# Patient Record
Sex: Male | Born: 1965 | Hispanic: Yes | Marital: Single | Smoking: Never smoker
Health system: Southern US, Community
[De-identification: ages and names within clinical notes are randomized; demographics above are authoritative.]

## PROBLEM LIST (undated history)

## (undated) DIAGNOSIS — E079 Disorder of thyroid, unspecified: Secondary | ICD-10-CM

---

## 2017-06-10 ENCOUNTER — Ambulatory Visit: Payer: Worker's Compensation | Attending: Physical Medicine and Rehabilitation | Admitting: Physical Therapy

## 2017-06-10 ENCOUNTER — Encounter: Payer: Self-pay | Admitting: Physical Therapy

## 2017-06-10 DIAGNOSIS — G8929 Other chronic pain: Secondary | ICD-10-CM | POA: Insufficient documentation

## 2017-06-10 DIAGNOSIS — R293 Abnormal posture: Secondary | ICD-10-CM | POA: Insufficient documentation

## 2017-06-10 DIAGNOSIS — M545 Low back pain: Secondary | ICD-10-CM | POA: Insufficient documentation

## 2017-06-10 DIAGNOSIS — M62830 Muscle spasm of back: Secondary | ICD-10-CM | POA: Insufficient documentation

## 2017-06-10 NOTE — Patient Instructions (Signed)
   ADL - LOG ROLL  GETTING IN BED: Start by sitting on the edge of the bed. Next, lower your self down lying on your side using your arms. Once fully on your side, roll onto your back.  When rolling be sure y our knees stay bent and that you roll your whole body together as one unit. Your shoulders, pelvis and knees all roll as one.    GETTING OUT OF BED: Start by bending your knees and then roll onto your side. Reach your arm across your body to initiate the rolling. When rolling, be sure that you roll your whole body together as one unit. Your shoulders, pelvis and knees should all roll together. Once on our side, tip yourself up to sitting using your arms.     [facility] [address], [address] [place], [place] [address] Phone # [phone] Fax [phone]  

## 2017-06-10 NOTE — Therapy (Signed)
[facility] [address] [place], [place], [address] Phone: [phone]   Fax:  [phone]  Physical Therapy Evaluation  Patient Details  Name: Raymond Nielsen MRN: [id number] Date of Birth: 02/22/1966 Referring Provider: Carney Harder, MD    Encounter Date: 06/10/2017  PT End of Session - 06/10/17 1252    Visit Number  1    Number of Visits  12    Date for PT Re-Evaluation  07/22/17    Authorization Time Period  06/10/17 to 07/22/17    Authorization - Visit Number  1    Authorization - Number of Visits  12    PT Start Time  1607    PT Stop Time  1145    PT Time Calculation (min)  43 min    Activity Tolerance  Patient tolerated treatment well    Behavior During Therapy  [facility] for tasks assessed/performed       History reviewed. No pertinent past medical history.  History reviewed. No pertinent surgical history.  There were no vitals filed for this visit.   Subjective Assessment - 06/10/17 1105    Subjective  Pt reports that his low back and Lt leg started bothering him back in June when he was placing a box down onto the ground. He also reports Lt shoulder pain that was onset the next day. Currently, he feels that his low back is the most irritating and would benefit from addressing this area first. 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 His back is limiting his ability to participate in the job. He reports no change in bowel/bladder function.     Pertinent History  asthmas    Limitations  Lifting    How long can you sit comfortably?  10-15 min     How long can you walk comfortably?  unlimited     Patient Stated Goals  improve pain and ability to complete lifting for work.     Currently in Pain?  Other (Comment) no pain, just uncomfortable    no pain, just uncomfortable    Pain Location  Back    Pain Orientation  Left    Pain Descriptors / Indicators  Aching;Throbbing    Pain  Type  Chronic pain    Pain Radiating Towards  numbness lateral aspect of Lt thigh     Pain Onset  More than a month ago    Pain Frequency  Constant    Aggravating Factors   bending forward, sitting for too long, laying flat on his back      Pain Relieving Factors  sitting slouched to the Rt, ice, walking     Effect of Pain on Daily Activities  significant limitation with work activity         [facility] PT Assessment - 06/10/17 0001      Assessment   Referring Provider  Carney Harder, MD     Next MD Visit  none for now     Prior Therapy  a couple of weeks       Precautions   Precautions  None      Balance Screen   Has the patient fallen in the past 6 months  No    Has the patient had a decrease in activity level because of a fear of falling?   No    Is the patient reluctant to leave their home because of a fear of falling?  No      Home Film/video editor residence      Prior Function   Vocation  Full time employment    Vocation Requirements  currently not working, Company secretary of driving/lifting       Cognition   Overall Cognitive Status  Within Functional Limits for tasks assessed      Observation/Other Assessments   Other Surveys   Other Surveys    Oswestry Disability Index   48% limited (Severe disability)       Sensation   Light Touch  Impaired by gross assessment    Additional Comments  Impaired sensation over Lt lateral thigh       Posture/Postural Control   Posture Comments  Pt sitting with Rt lateral shift, forward head/rounded shoulders, increased thoracic kyphosis       ROM / Strength   AROM / PROM / Strength  AROM;Strength      AROM   AROM Assessment Site  Lumbar    Lumbar Flexion  50% limited, pulling pain    Lumbar Extension  75% limited, pain end range (clicking noted)       Strength   Overall Strength Comments  Unable to accurately assess due to high pain report with resistance testing       Flexibility   Soft Tissue Assessment /Muscle  Length  yes    Quadriceps  (+) Ely's BLE    Piriformis  50% limited       Palpation   Spinal mobility  pt unable to tolerate spring testing, generally hypomobile throughout the lumbar spine     SI assessment   Lt posterior innominate, possible upslip on the Lt     Palpation comment  tenderness along lumbar paraspinals/L4-L5 multifidi, Lt SI tender with palpation      Transfers   Comments  Use of UE to assist with standing              Objective measurements completed on examination: See above findings.      [facility] PT Treatment/Exercise - 06/10/17 0001      Therapeutic Activites    Therapeutic Activities  Other Therapeutic Activities    Other Therapeutic Activities  log roll x2 reps with therapist instruction to improve technique and understanding       Exercises   Exercises  Lumbar      Lumbar Exercises: Supine   Straight Leg Raise  5 reps    Straight Leg Raises Limitations  LLE only       Manual Therapy   Manual Therapy  Muscle Energy Technique;Soft tissue mobilization    Soft tissue mobilization  STM Lt lumbar paraspinals     Muscle Energy Technique  MET to correct Lt posterior innominate              PT Education - 06/10/17 1251    Education provided  Yes    Education Details  eval findings/POC; benefits of improving mechanics with bed mobility/lifting to decrease strain on low back/neck; log roll technique    Person(s) Educated  Patient    Methods  Explanation;Verbal cues;Handout    Comprehension  Returned demonstration;Verbalized understanding       PT Short Term Goals - 06/10/17 1301      PT SHORT TERM GOAL #1   Title  Pt will demo consistency and independence with his HEP to increase his flexibility and decrease pain.     Time  3    Period  Weeks    Status  New    Target Date  07/01/17      PT SHORT TERM GOAL #2   Title  Pt will demo improved mobility and awareness of proper body mechanics, evident by his ability to complete log roll  when transferring on/off the mat table during his sessions without cues from the therapist.     Time  3    Period  Weeks    Status  New        PT Long Term Goals - 06/10/17 1305      PT LONG TERM GOAL #1   Title  Pt will be able to lift a 10# box with proper mechanics and without cuing from the therapist, ateast 5 reps, to allow for carry over into daily house/work activity.      Time  6    Period  Weeks    Status  New    Target Date  07/22/17      PT LONG TERM GOAL #2   Title  Pt will demo improved BLE quadriceps flexibility, evident by atleast 100 deg of knee flexion during Ely's testing.     Time  6    Period  Weeks    Status  New      PT LONG TERM GOAL #3   Title  Pt will report atleast 40% improvement in his low back pain from the start of therapy, to improve his quality of life and activity participation.     Time  6    Period  Weeks    Status  New      PT LONG TERM GOAL #4   Title  Pt will demo atleast 10% improvement on the Oswestry low back pain disability questionnaire to represent an improvement in his functional capacity.     Time  6    Period  Weeks    Status  New             Plan - 06/10/17 1255    Clinical Impression Statement  Pt is a 51 y.o M referred to OPPT by his MD for complaints of Lt neck/shoulder and Lt low back/LE pain onset several months ago when trying to lower a box to the ground. Since the onset, he has had some therapy up in [place], however he recently relocated to [facility] and is hoping to continue his therapy now that he is here. Pt currently would like to focus on his low back secondary to how this is limiting his ability to participate in his job which involves lifting. He presents with significantly limited lumbar AROM, muscle spasm along the low lumbar region, hypomobile lumbar segments and possible pelvic asymmetries contributing to his pain and referred symptoms down his lateral thigh. He demonstrates poor postural awareness and body  mechanics with bed mobility, and required education today regarding proper log roll technique when getting in/out of bed. Therapist was unable to complete formal strength testing due to high levels of pain, however he does demonstrate general limitations in hip flexibility. Pt would benefit from skilled PT to address his limitations in ROM, strength, body mechanics and decrease his pain with daily activity.     Clinical Presentation  Unstable    Clinical Presentation due to:  high pain levels with positioning/movements; multiple areas of pain/concerns    Clinical Decision Making  Moderate    Rehab Potential  Fair    Clinical Impairments Affecting Rehab Potential  (+) pt appears to  be highly motivated, (-) chronicity of symptoms    PT Frequency  2x / week    PT Duration  6 weeks    PT Treatment/Interventions  ADLs/Self Care Home Management;Cryotherapy;Electrical Stimulation;Moist Heat;Traction;Therapeutic exercise;Therapeutic activities;Functional mobility training;Neuromuscular re-education;Patient/family education;Manual techniques;Passive range of motion;Dry needling;Taping    PT Next Visit Plan  further SI assessment; gentle hip ROM SKTC/DKTC/low trunk rotation; deep abdominal activation     PT Home Exercise Plan  next session     Consulted and Agree with Plan of Care  Patient       Patient will benefit from skilled therapeutic intervention in order to improve the following deficits and impairments:  Decreased activity tolerance, Impaired flexibility, Hypomobility, Decreased strength, Decreased range of motion, Postural dysfunction, Increased muscle spasms, Decreased mobility, Impaired sensation, Improper body mechanics, Pain  Visit Diagnosis: Chronic left-sided low back pain, with sciatica presence unspecified  Muscle spasm of back  Abnormal posture     Problem List There are no active problems to display for this patient.   1:33 PM,06/10/17 [place], [place] at [place]  [facility] [address] [place], [place], [address] Phone: [phone]   Fax:  [phone]  Name: Raymond Nielsen MRN: [id number] Date of Birth: [birth year]

## 2017-06-17 ENCOUNTER — Ambulatory Visit: Payer: Worker's Compensation | Admitting: Physical Therapy

## 2017-06-17 ENCOUNTER — Encounter: Payer: Self-pay | Admitting: Physical Therapy

## 2017-06-17 DIAGNOSIS — M545 Low back pain: Secondary | ICD-10-CM | POA: Diagnosis not present

## 2017-06-17 DIAGNOSIS — M62830 Muscle spasm of back: Secondary | ICD-10-CM

## 2017-06-17 DIAGNOSIS — R293 Abnormal posture: Secondary | ICD-10-CM

## 2017-06-17 DIAGNOSIS — G8929 Other chronic pain: Secondary | ICD-10-CM

## 2017-06-17 NOTE — Patient Instructions (Signed)
Supine Knee-to-Chest, Unilateral    Lie on back, hands clasped behind left knee. Pull knee in toward chest until a comfortable stretch is felt in lower back and buttocks and keep your other knee bent, not straight like it is in the picture. . Hold __3_ breaths. Relax Repeat _3__ times per session. Do _3__ sessions per day.  Copyright  VHI. All rights reserved.   Lower Trunk Rotation    Bring both knees in to chest. Rotate from side to side, keeping knees together and feet on the floor.Start with gentle rocking side to side, THEN as it feels better hold the stretch on each side for 3 breaths.  Repeat _10 rocks per set. Building to 3 breath holds each side. . Do 2_ sessions per day.  http://orth.exer.us/150   Copyright  VHI. All rights reserved.     Piriformis Stretch, Supine    Lie supine, left ankle on right knee.Gently pull LT knee towards the right shoulder. You should feel stretch in your hip & buttocks. Hold _3_breaths.   Repeat __3_ times per session. Do __3_ sessions per day.  Copyright  VHI. All rights reserved.

## 2017-06-17 NOTE — Therapy (Signed)
[facility] [address] [place], [place], [address] Phone: [phone]   Fax:  [phone]  Physical Therapy Treatment  Patient Details  Name: Raymond MaineDaniel Nielsen MRN: [id number] Date of Birth: 11-01-1965 Referring Provider: Leta JunglingLam Quan, MD    Encounter Date: 06/17/2017  PT End of Session - 06/17/17 1232    Visit Number  2    Number of Visits  12    Date for PT Re-Evaluation  07/22/17    Authorization Time Period  06/10/17 to 07/22/17    Authorization - Visit Number  2    Authorization - Number of Visits  12    PT Start Time  1230    PT Stop Time  1320    PT Time Calculation (min)  50 min    Activity Tolerance  Patient tolerated treatment well    Behavior During Therapy  [facility]       History reviewed. No pertinent past medical history.  History reviewed. No pertinent surgical history.  There were no vitals filed for this visit.  Subjective Assessment - 06/17/17 1234    Subjective  Yesterday was a good day, today is slightly more.     Currently in Pain?  Yes    Pain Score  4     Pain Location  Back    Pain Orientation  Left    Pain Descriptors / Indicators  Aching    Aggravating Factors   sitting too long    Pain Relieving Factors  decompression    Multiple Pain Sites  No                      [facility] Adult PT Treatment/Exercise - 06/17/17 0001      Lumbar Exercises: Stretches   Single Knee to Chest Stretch  3 reps;20 seconds BIl    Lower Trunk Rotation  -- Small rocking 10x, tried static stretch pt declined    Piriformis Stretch  2 reps;20 seconds      Lumbar Exercises: Aerobic   Stationary Bike  L1 x 5 min  PTA present with status update.       Lumbar Exercises: Supine   Ab Set  -- TA contraction with PIlates breath.      Cryotherapy   Number Minutes Cryotherapy  10 Minutes post session    Cryotherapy Location  Lumbar Spine    Type of Cryotherapy  Ice pack       Manual Therapy   Manual Therapy  Manual Traction    Manual Traction  Attempted LTLE long leg traction. Stopped per pt request, it hurt.              PT Education - 06/17/17 1524    Education provided  Yes    Education Details  Basic HEP: single knee to chest, lower trunk rocking, use of ice, piriformis stretch LT    Person(s) Educated  Patient    Methods  Explanation;Demonstration;Tactile cues;Verbal cues;Handout    Comprehension  Verbalized understanding;Returned demonstration       PT Short Term Goals - 06/10/17 1301      PT SHORT TERM GOAL #1   Title  Pt will demo consistency and independence with his HEP to increase his flexibility and decrease pain.     Time  3    Period  Weeks    Status  New    Target Date  07/01/17      PT SHORT TERM  GOAL #2   Title  Pt will demo improved mobility and awareness of proper body mechanics, evident by his ability to complete log roll when transferring on/off the mat table during his sessions without cues from the therapist.     Time  3    Period  Weeks    Status  New        PT Long Term Goals - 06/10/17 1305      PT LONG TERM GOAL #1   Title  Pt will be able to lift a 10# box with proper mechanics and without cuing from the therapist, ateast 5 reps, to allow for carry over into daily house/work activity.      Time  6    Period  Weeks    Status  New    Target Date  07/22/17      PT LONG TERM GOAL #2   Title  Pt will demo improved BLE quadriceps flexibility, evident by atleast 100 deg of knee flexion during Ely's testing.     Time  6    Period  Weeks    Status  New      PT LONG TERM GOAL #3   Title  Pt will report atleast 40% improvement in his low back pain from the start of therapy, to improve his quality of life and activity participation.     Time  6    Period  Weeks    Status  New      PT LONG TERM GOAL #4   Title  Pt will demo atleast 10% improvement on the Oswestry low back pain disability questionnaire to  represent an improvement in his functional capacity.     Time  6    Period  Weeks    Status  New            Plan - 06/17/17 1233    Clinical Impression Statement  Pt presents today with low level pain but significantly afraid to move due to pain. Pt was able to perform low level stretches without exacerbating his pain. These were given to him for HEP. Pt requested trial of traction. we started with manual traction to the LTLE which he did not tolerate. He asked that PTA stop due to pain in the LT hip/side. He agreeed to ice at home.      Rehab Potential  Fair    Clinical Impairments Affecting Rehab Potential  (+) pt appears to be highly motivated, (-) chronicity of symptoms    PT Frequency  2x / week    PT Duration  6 weeks    PT Treatment/Interventions  ADLs/Self Care Home Management;Cryotherapy;Electrical Stimulation;Moist Heat;Traction;Therapeutic exercise;Therapeutic activities;Functional mobility training;Neuromuscular re-education;Patient/family education;Manual techniques;Passive range of motion;Dry needling;Taping    PT Next Visit Plan  See if pt doing HEp given today. He had no further appts after today and reports he plans on calling our office tomorrow to schedule.     Consulted and Agree with Plan of Care  Patient       Patient will benefit from skilled therapeutic intervention in order to improve the following deficits and impairments:  Decreased activity tolerance, Impaired flexibility, Hypomobility, Decreased strength, Decreased range of motion, Postural dysfunction, Increased muscle spasms, Decreased mobility, Impaired sensation, Improper body mechanics, Pain  Visit Diagnosis: Chronic left-sided low back pain, with sciatica presence unspecified  Muscle spasm of back  Abnormal posture     Problem List There are no active problems to display for this patient.  Raymond Nielsen, PTA 06/17/2017, 3:26 PM  [facility] [address] [place], [place], [address] Phone: [phone]   Fax:  [phone]  Name: Raymond Nielsen MRN: [id number] Date of Birth: [birth year]

## 2017-06-24 ENCOUNTER — Encounter: Payer: Self-pay | Admitting: Physical Therapy

## 2017-06-24 ENCOUNTER — Ambulatory Visit: Payer: Worker's Compensation | Admitting: Physical Therapy

## 2017-06-24 DIAGNOSIS — M545 Low back pain: Principal | ICD-10-CM

## 2017-06-24 DIAGNOSIS — G8929 Other chronic pain: Secondary | ICD-10-CM

## 2017-06-24 DIAGNOSIS — R293 Abnormal posture: Secondary | ICD-10-CM

## 2017-06-24 DIAGNOSIS — M62830 Muscle spasm of back: Secondary | ICD-10-CM

## 2017-06-24 NOTE — Therapy (Signed)
[facility] [address], [address] [place], [place], [address] Phone: [phone]   Fax:  [phone]  Physical Therapy Treatment  Patient Details  Name: Raymond MaineDaniel Nielsen MRN: [id number] Date of Birth: [birth year] Referring Provider: Leta JunglingLam Quan, MD    Encounter Date: 06/24/2017  PT End of Session - 06/24/17 1113    Visit Number  3    Number of Visits  12    Date for PT Re-Evaluation  07/22/17    Authorization Time Period  06/10/17 to 07/22/17    Authorization - Visit Number  3    Authorization - Number of Visits  12    PT Start Time  1105    PT Stop Time  1205    PT Time Calculation (min)  60 min    Activity Tolerance  Patient tolerated treatment well    Behavior During Therapy  [facility] for tasks assessed/performed       History reviewed. No pertinent past medical history.  History reviewed. No pertinent surgical history.  There were no vitals filed for this visit.  Subjective Assessment - 06/24/17 1115    Subjective  Pain still high. Reports his back "gave way" when sneezing yesterday and "fell to the floor." He was "sore" after last session but this might have been more muscular.     Currently in Pain?  Yes Numbness remains lateral thigh, numbness bil hands    Pain Score  8     Pain Location  Back    Pain Orientation  Left;Lower    Pain Descriptors / Indicators  Constant    Multiple Pain Sites  No                      [facility] Adult PT Treatment/Exercise - 06/24/17 0001      Lumbar Exercises: Stretches   Single Knee to Chest Stretch  3 reps;10 seconds bil      Lumbar Exercises: Aerobic   Stationary Bike  Nustep L1 x 10 min      Lumbar Exercises: Sidelying   Other Sidelying Lumbar Exercises  TA contraction with glute set 2x5 Got uncomfortable in supine, S-L was better.       Lumbar Exercises: Prone   Other Prone Lumbar Exercises  Could not lay prone d/t pain      Cryotherapy   Number Minutes  Cryotherapy  15 Minutes    Cryotherapy Location  Lumbar Spine    Type of Cryotherapy  Ice pack      Electrical Stimulation   Electrical Stimulation Location  Lt lumbar to proximal glutes    Electrical Stimulation Action  IFC    Electrical Stimulation Parameters  to tolerance,     Electrical Stimulation Goals  Pain               PT Short Term Goals - 06/24/17 1150      PT SHORT TERM GOAL #1   Title  Pt will demo consistency and independence with his HEP to increase his flexibility and decrease pain.     Time  3    Period  Weeks    Status  On-going      PT SHORT TERM GOAL #2   Title  Pt will demo improved mobility and awareness of proper body mechanics, evident by his ability to complete log roll when transferring on/off the mat table during his sessions without cues from the therapist.     Time  3  Period  Weeks    Status  Achieved        PT Long Term Goals - 06/10/17 1305      PT LONG TERM GOAL #1   Title  Pt will be able to lift a 10# box with proper mechanics and without cuing from the therapist, ateast 5 reps, to allow for carry over into daily house/work activity.      Time  6    Period  Weeks    Status  New    Target Date  07/22/17      PT LONG TERM GOAL #2   Title  Pt will demo improved BLE quadriceps flexibility, evident by atleast 100 deg of knee flexion during Ely's testing.     Time  6    Period  Weeks    Status  New      PT LONG TERM GOAL #3   Title  Pt will report atleast 40% improvement in his low back pain from the start of therapy, to improve his quality of life and activity participation.     Time  6    Period  Weeks    Status  New      PT LONG TERM GOAL #4   Title  Pt will demo atleast 10% improvement on the Oswestry low back pain disability questionnaire to represent an improvement in his functional capacity.     Time  6    Period  Weeks    Status  New            Plan - 06/24/17 1114    Clinical Impression Statement  Pt  presents today with higher level of painand could not tolerate any one position for very long. Because of this pt agreed to try some Estim with ice to attempt pain reduction. Pt had requested work on the LT shoulder today. PTA explained it was not evaluated initially so we could not do that today. PLan to have follow up discussion with evaluating PT to check on this.     Rehab Potential  Fair    Clinical Impairments Affecting Rehab Potential  (+) pt appears to be highly motivated, (-) chronicity of symptoms    PT Frequency  2x / week    PT Duration  6 weeks    PT Treatment/Interventions  ADLs/Self Care Home Management;Cryotherapy;Electrical Stimulation;Moist Heat;Traction;Therapeutic exercise;Therapeutic activities;Functional mobility training;Neuromuscular re-education;Patient/family education;Manual techniques;Passive range of motion;Dry needling;Taping    PT Next Visit Plan  Gentle movements, stretches, low level core. Pain management.     Consulted and Agree with Plan of Care  Patient       Patient will benefit from skilled therapeutic intervention in order to improve the following deficits and impairments:  Decreased activity tolerance, Impaired flexibility, Hypomobility, Decreased strength, Decreased range of motion, Postural dysfunction, Increased muscle spasms, Decreased mobility, Impaired sensation, Improper body mechanics, Pain  Visit Diagnosis: Chronic left-sided low back pain, with sciatica presence unspecified  Muscle spasm of back  Abnormal posture     Problem List There are no active problems to display for this patient.   Ursula Dermody, PTA 06/24/2017, 11:51 AM  [facility] [address], [address] [place], [place], [address] Phone: [phone]   Fax:  [phone]  Name: Raymond MaineDaniel Nielsen MRN: [id number] Date of Birth: February 10, 1966

## 2017-06-30 ENCOUNTER — Ambulatory Visit: Payer: Worker's Compensation

## 2017-06-30 DIAGNOSIS — M545 Low back pain: Principal | ICD-10-CM

## 2017-06-30 DIAGNOSIS — G8929 Other chronic pain: Secondary | ICD-10-CM

## 2017-06-30 DIAGNOSIS — R293 Abnormal posture: Secondary | ICD-10-CM

## 2017-06-30 DIAGNOSIS — M62830 Muscle spasm of back: Secondary | ICD-10-CM

## 2017-06-30 NOTE — Therapy (Signed)
[facility] [address], [address] [place], [place], [address] Phone: [phone]   Fax:  [phone]  Physical Therapy Treatment  Patient Details  Name: Raymond Nielsen MRN: [id number] Date of Birth: 05/16/1966 Referring Provider: Leta JunglingLam Quan, MD    Encounter Date: 06/30/2017  PT End of Session - 06/30/17 1011    Visit Number  4    Number of Visits  12    Date for PT Re-Evaluation  07/22/17    PT Start Time  0950 late    PT Stop Time  1029    PT Time Calculation (min)  39 min    Activity Tolerance  Patient tolerated treatment well    Behavior During Therapy  [facility] for tasks assessed/performed       History reviewed. No pertinent past medical history.  History reviewed. No pertinent surgical history.  There were no vitals filed for this visit.  Subjective Assessment - 06/30/17 0951    Subjective  Pt is 20 minutes late.  My shoulder is sore.      Pertinent History  body weight 206#    Currently in Pain?  Yes    Pain Score  6     Pain Location  Back    Pain Orientation  Left;Lower    Pain Descriptors / Indicators  Constant    Pain Type  Chronic pain    Pain Onset  More than a month ago    Pain Frequency  Constant    Aggravating Factors   bending over, sleep, walking too long    Pain Relieving Factors  lying down         [facility] 06/30/17 0001      Assessment   Medical Diagnosis  lumbar spine, Lt shoulder                  [facility] Adult PT Treatment/Exercise - 06/30/17 0001      Lumbar Exercises: Stretches   Single Knee to Chest Stretch  3 reps;10 seconds bil      Lumbar Exercises: Aerobic   Stationary Bike  Nustep L1 x 7 min      Lumbar Exercises: Sidelying   Clam  20 reps verbal and tactile cues for technique      Modalities   Modalities  Traction      Traction   Type of Traction  Lumbar    Min (lbs)  50    Max (lbs)  110    Hold Time  60    Rest Time  10    Time  15                PT Short Term Goals - 06/30/17 0953      PT SHORT TERM GOAL #1   Title  Pt will demo consistency and independence with his HEP to increase his flexibility and decrease pain.     Status  Achieved      PT SHORT TERM GOAL #2   Title  Pt will demo improved mobility and awareness of proper body mechanics, evident by his ability to complete log roll when transferring on/off the mat table during his sessions without cues from the therapist.     Status  Achieved        PT Long Term Goals - 06/10/17 1305      PT LONG TERM GOAL #1   Title  Pt will be able to lift a 10# box with proper mechanics  and without cuing from the therapist, ateast 5 reps, to allow for carry over into daily house/work activity.      Time  6    Period  Weeks    Status  New    Target Date  07/22/17      PT LONG TERM GOAL #2   Title  Pt will demo improved BLE quadriceps flexibility, evident by atleast 100 deg of knee flexion during Ely's testing.     Time  6    Period  Weeks    Status  New      PT LONG TERM GOAL #3   Title  Pt will report atleast 40% improvement in his low back pain from the start of therapy, to improve his quality of life and activity participation.     Time  6    Period  Weeks    Status  New      PT LONG TERM GOAL #4   Title  Pt will demo atleast 10% improvement on the Oswestry low back pain disability questionnaire to represent an improvement in his functional capacity.     Time  6    Period  Weeks    Status  New            Plan - 06/30/17 1000    Clinical Impression Statement  Pt with continued reports of Lt shoulder pain.  This was not evaluated so will need to be evaluated in order to perform treatment.  Pt was late today so this was not able to be done.  Pt reports minimal compliance with HEP for flexibility.  Trial of lumbar traction to address lumbar symptoms and Lt LE radiculpathy.  Pt will continue to benefit from skilled PT for core strength, flexibility,  traction if helpful and to evaluate Lt shoulder.      Rehab Potential  Fair    PT Frequency  2x / week    PT Duration  6 weeks    PT Treatment/Interventions  ADLs/Self Care Home Management;Cryotherapy;Electrical Stimulation;Moist Heat;Traction;Therapeutic exercise;Therapeutic activities;Functional mobility training;Neuromuscular re-education;Patient/family education;Manual techniques;Passive range of motion;Dry needling;Taping    PT Next Visit Plan  Gentle movements, stretches, low level core. Pain management. Evaluate Lt shoulder    Consulted and Agree with Plan of Care  Patient       Patient will benefit from skilled therapeutic intervention in order to improve the following deficits and impairments:  Decreased activity tolerance, Impaired flexibility, Hypomobility, Decreased strength, Decreased range of motion, Postural dysfunction, Increased muscle spasms, Decreased mobility, Impaired sensation, Improper body mechanics, Pain  Visit Diagnosis: Chronic left-sided low back pain, with sciatica presence unspecified  Muscle spasm of back  Abnormal posture     Problem List There are no active problems to display for this patient.   Lorrene ReidKelly Dell Briner, PT 06/30/17 10:14 AM  [facility] [address], [address] [place], [place], [address] Phone: [phone]   Fax:  [phone]  Name: Raymond Nielsen MRN: [id number] Date of Birth: [birth year]

## 2017-07-07 ENCOUNTER — Ambulatory Visit: Payer: Worker's Compensation | Attending: Physical Medicine and Rehabilitation

## 2017-07-07 DIAGNOSIS — M25612 Stiffness of left shoulder, not elsewhere classified: Secondary | ICD-10-CM | POA: Diagnosis present

## 2017-07-07 DIAGNOSIS — M62830 Muscle spasm of back: Secondary | ICD-10-CM

## 2017-07-07 DIAGNOSIS — R293 Abnormal posture: Secondary | ICD-10-CM | POA: Diagnosis present

## 2017-07-07 DIAGNOSIS — G8929 Other chronic pain: Secondary | ICD-10-CM | POA: Diagnosis present

## 2017-07-07 DIAGNOSIS — M25512 Pain in left shoulder: Secondary | ICD-10-CM | POA: Insufficient documentation

## 2017-07-07 DIAGNOSIS — M545 Low back pain: Secondary | ICD-10-CM | POA: Insufficient documentation
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
## 2017-07-07 NOTE — Patient Instructions (Addendum)
Hold all stretches 5-10 seconds and perform 5-10 times, 3 times a day.   Slide right arm up wall, with  PALM FACING THE WALL, by leaning toward wall.  Copyright  VHI. All rights reserved.   Sitting upright, slide forearm forward along table, bending from the waist until a stretch is felt. Copyright  VHI. All rights reserved.    Clasp hands together and raise arms above head, keeping elbows as straight as possible. Can be done sitting or lying.   Copyright  VHI. All rights reserved.  KEEP HEAD IN NEUTRAL AND SHOULDERS DOWN AND RELAXED   Hold tubing in right hand, arm forward. Pull arm back, elbow straight. Repeat __10__ times per set. Do __2__ sets per session. Do _1-2___ sessions per day.  Copyright  VHI. All rights reserved.     With resistive band anchored in door, grasp both ends. Keeping elbows bent, pull back, squeezing shoulder blades together. Hold _3__ seconds. Repeat _2x10___ times. Do _1-2___ sessions per day.  [email or web address].exer.us/98   [facility] [address], [address] [place], [place] [address] Phone # [phone] Fax [phone]

## 2017-07-09 ENCOUNTER — Ambulatory Visit: Payer: Worker's Compensation | Admitting: Physical Therapy

## 2017-07-10 ENCOUNTER — Encounter: Payer: Self-pay | Admitting: Physical Therapy

## 2017-07-10 ENCOUNTER — Ambulatory Visit: Payer: Worker's Compensation | Admitting: Physical Therapy

## 2017-07-10 DIAGNOSIS — R293 Abnormal posture: Secondary | ICD-10-CM

## 2017-07-10 DIAGNOSIS — M25612 Stiffness of left shoulder, not elsewhere classified: Secondary | ICD-10-CM

## 2017-07-10 DIAGNOSIS — M62830 Muscle spasm of back: Secondary | ICD-10-CM

## 2017-07-10 DIAGNOSIS — M545 Low back pain: Secondary | ICD-10-CM | POA: Diagnosis not present

## 2017-07-10 DIAGNOSIS — G8929 Other chronic pain: Secondary | ICD-10-CM

## 2017-07-10 DIAGNOSIS — M25512 Pain in left shoulder: Secondary | ICD-10-CM

## 2017-07-10 NOTE — Patient Instructions (Signed)

## 2017-07-10 NOTE — Therapy (Signed)
[facility] [address] [place], [place], [address] Phone: [phone]   Fax:  [phone]  Physical Therapy Treatment  Patient Details  Name: Raymond Nielsen MRN: [id number] Date of Birth: 10/04/1965 Referring Provider: Leta Jungling, MD   Encounter Date: 07/10/2017  PT End of Session - 07/10/17 1036    Visit Number  6    Number of Visits  12    Date for PT Re-Evaluation  08/18/17    Authorization Time Period  06/10/17 to 07/22/17    Authorization - Visit Number  6    Authorization - Number of Visits  12    PT Start Time  0930    PT Stop Time  1025    PT Time Calculation (min)  55 min    Activity Tolerance  Patient tolerated treatment well    Behavior During Therapy  [facility] for tasks assessed/performed       History reviewed. No pertinent past medical history.  History reviewed. No pertinent surgical history.  There were no vitals filed for this visit.  Subjective Assessment - 07/10/17 0935    Subjective  I slept on my left shoulder and it went numb.  I am not feeling good today.     Pertinent History  body weight 206#    Limitations  Lifting    How long can you sit comfortably?  10-15 min     How long can you walk comfortably?  unlimited     Patient Stated Goals  improve pain and ability to complete lifting for work.     Currently in Pain?  Yes    Pain Score  5     Pain Location  Back    Pain Orientation  Left;Lower    Pain Descriptors / Indicators  Constant    Pain Type  Chronic pain    Pain Onset  More than a month ago    Pain Frequency  Constant    Aggravating Factors   bending over, sleep , walking too long    Pain Relieving Factors  lay on side    Effect of Pain on Daily Activities  significant limitation with work activity    Multiple Pain Sites  Yes    Pain Score  8    Pain Location  Shoulder    Pain Orientation  Left    Pain Descriptors / Indicators  Aching    Pain Type  Chronic pain    Pain  Onset  More than a month ago    Pain Frequency  Constant    Aggravating Factors   lifting things, push ups, use of left UE     Pain Relieving Factors  rest gentle movement                      [facility] Adult PT Treatment/Exercise - 07/10/17 0001      Lumbar Exercises: Standing   Other Standing Lumbar Exercises  Lateral shift correction with left side against the wall for 2 min; then lumbar extension 5 times but very painful and limited      Shoulder Exercises: Standing   Flexion  AAROM;Strengthening;Left;15 reps against the wall with PT guiding the scapula      Manual Therapy   Manual Therapy  Soft tissue mobilization;Joint mobilization    Joint Mobilization  sacra mobilization to correct left rotation; P-A and rotational mobilization to T3-T8 and L1-L5 grade 3    Soft tissue mobilization  lumbar  paraspinals and left shoulder girdle, upper trap, rhomboid        Trigger Point Dry Needling - 07/10/17 1028    Consent Given?  Yes    Education Handout Provided  Yes    Muscles Treated Upper Body  Upper trapezius;Subscapularis;Infraspinatus    Muscles Treated Lower Body  -- bil. lumbar multifidi L1-L5    Upper Trapezius Response  Twitch reponse elicited;Palpable increased muscle length    Infraspinatus Response  Twitch response elicited;Palpable increased muscle length    Subscapularis Response  Twitch response elicited;Palpable increased muscle length           PT Education - 07/10/17 1036    Education provided  Yes    Education Details  information on dry needling    Person(s) Educated  Patient    Methods  Explanation    Comprehension  Verbalized understanding;Returned demonstration       PT Short Term Goals - 06/30/17 0953      PT SHORT TERM GOAL #1   Title  Pt will demo consistency and independence with his HEP to increase his flexibility and decrease pain.     Status  Achieved      PT SHORT TERM GOAL #2   Title  Pt will demo improved mobility and  awareness of proper body mechanics, evident by his ability to complete log roll when transferring on/off the mat table during his sessions without cues from the therapist.     Status  Achieved        PT Long Term Goals - 07/07/17 [id number]      PT LONG TERM GOAL #1   Title  Pt will be able to lift a 10# box with proper mechanics and without cuing from the therapist, ateast 5 reps, to allow for carry over into daily house/work activity.      Time  6    Period  Weeks    Status  On-going      PT LONG TERM GOAL #2   Title  Pt will demo improved BLE quadriceps flexibility, evident by atleast 100 deg of knee flexion during Ely's testing.     Period  Weeks    Status  On-going      PT LONG TERM GOAL #3   Title  Pt will report atleast 40% improvement in his low back pain from the start of therapy, to improve his quality of life and activity participation.     Time  6    Period  Weeks    Status  On-going      PT LONG TERM GOAL #4   Title  Pt will demo atleast 10% improvement on the Oswestry low back pain disability questionnaire to represent an improvement in his functional capacity.     Time  6    Period  Weeks    Status  On-going      PT LONG TERM GOAL #5   Title  improve quickdash score by 10 points (< or = to 38/100) to improve function of Lt UE    Time  6    Period  Weeks    Status  New    Target Date  08/18/17      Additional Long Term Goals   Additional Long Term Goals  Yes      PT LONG TERM GOAL #6   Title  demonstrate Lt shoulder A/ROM flexion to > or = to 130 to improve overhead reaching    Time  6  Period  Weeks    Status  New    Target Date  08/18/17      PT LONG TERM GOAL #7   Title  report < or = to 4/10 Lt shoulder pain with lifting and use with ADLs    Time  6    Period  Weeks    Status  New    Target Date  08/18/17            Plan - 07/10/17 1037    Clinical Impression Statement  Patient has increased pain in left shoulder today due to laying on it  at night.  Patient has decreased lumbar extension by 100% but after therapy gained 25% motion.  Patient has decreased mobility of L1-L5 but after dry needling increased movement of the spine.  Patient has trouble relaxing the left upper trap with left shoulder motion and needed tactile cues to relax.  Patient had trigger points in the lumbar and left shoulder girdle.  Patient will benefit from skilled therapy to improve left shoulder flexibility, postural strength, rotator cuff strength and address lumbar pain and core strength.     Rehab Potential  Fair    Clinical Impairments Affecting Rehab Potential  (+) pt appears to be highly motivated, (-) chronicity of symptoms    PT Frequency  2x / week    PT Duration  6 weeks    PT Treatment/Interventions  ADLs/Self Care Home Management;Cryotherapy;Electrical Stimulation;Moist Heat;Traction;Therapeutic exercise;Therapeutic activities;Functional mobility training;Neuromuscular re-education;Patient/family education;Manual techniques;Passive range of motion;Dry needling;Taping    PT Next Visit Plan  Gentle movements, stretches, low level core. Pain management. Lt shoulder-gentle flexibility and postural strength.  See how dry needling helped patient    PT Home Exercise Plan  progress left shoulder HEP with wall slides, pendulums, scapula retraction    Consulted and Agree with Plan of Care  Patient       Patient will benefit from skilled therapeutic intervention in order to improve the following deficits and impairments:  Decreased activity tolerance, Impaired flexibility, Hypomobility, Decreased strength, Decreased range of motion, Postural dysfunction, Increased muscle spasms, Decreased mobility, Impaired sensation, Improper body mechanics, Pain  Visit Diagnosis: Chronic left-sided low back pain, with sciatica presence unspecified  Muscle spasm of back  Abnormal posture  Chronic left shoulder pain  Stiffness of left shoulder, not elsewhere  classified     Problem List There are no active problems to display for this patient.   Eulis FosterCheryl Gray, PT 07/10/17 10:41 AM   [facility] [address], [address] [place], [place], [address] Phone: [phone]   Fax:  [phone]  Name: Gwinda MaineDaniel Skolnik MRN: [id number] Date of Birth: 08/22/1965

## 2017-07-16 ENCOUNTER — Encounter: Payer: Worker's Compensation | Admitting: Physical Therapy

## 2017-07-22 ENCOUNTER — Ambulatory Visit: Payer: Worker's Compensation | Admitting: Physical Therapy

## 2017-07-22 DIAGNOSIS — M62830 Muscle spasm of back: Secondary | ICD-10-CM

## 2017-07-22 DIAGNOSIS — M545 Low back pain: Principal | ICD-10-CM

## 2017-07-22 DIAGNOSIS — R293 Abnormal posture: Secondary | ICD-10-CM

## 2017-07-22 DIAGNOSIS — M25512 Pain in left shoulder: Secondary | ICD-10-CM

## 2017-07-22 DIAGNOSIS — G8929 Other chronic pain: Secondary | ICD-10-CM

## 2017-07-22 DIAGNOSIS — M25612 Stiffness of left shoulder, not elsewhere classified: Secondary | ICD-10-CM

## 2017-07-22 NOTE — Therapy (Signed)
[facility] [address] [place], [place], [address] Phone: [phone]   Fax:  [phone]  Physical Therapy Treatment  Patient Details  Name: Raymond Nielsen MRN: [id number] Date of Birth: 06-21-1966 Referring Provider: Leta Jungling, MD   Encounter Date: 07/22/2017  PT End of Session - 07/22/17 1126    Visit Number  7    Number of Visits  12    Date for PT Re-Evaluation  08/18/17    Authorization - Visit Number  7    Authorization - Number of Visits  12    PT Start Time  1107    PT Stop Time  1145    PT Time Calculation (min)  38 min    Activity Tolerance  Patient limited by pain    Behavior During Therapy  [facility] for tasks assessed/performed       No past medical history on file.  No past surgical history on file.  There were no vitals filed for this visit.  Subjective Assessment - 07/22/17 1108    Subjective  Pt reports that things are going about the same. He has pain currently, noting that his back is worse than his shoulder.     Pertinent History  body weight 206#    Limitations  Lifting    How long can you sit comfortably?  10-15 min     How long can you walk comfortably?  unlimited     Patient Stated Goals  improve pain and ability to complete lifting for work.     Currently in Pain?  Yes    Pain Score  6     Pain Location  Back    Pain Orientation  Left;Lower    Pain Descriptors / Indicators  Constant    Pain Type  Chronic pain    Pain Radiating Towards  Numbness lateral aspect of Lt thigh    Pain Onset  More than a month ago    Pain Frequency  Constant    Aggravating Factors   bending over, sleep, walking too long     Pain Relieving Factors  lay on side    Effect of Pain on Daily Activities  significant limitation with work activity     Multiple Pain Sites  Yes    Pain Score  4    Pain Location  Shoulder    Pain Orientation  Left    Pain Descriptors / Indicators  Aching    Pain Type  Chronic  pain    Pain Radiating Towards  none     Pain Onset  More than a month ago    Pain Frequency  Constant                      [facility] Adult PT Treatment/Exercise - 07/22/17 0001      Shoulder Exercises: Supine   External Rotation  Left;15 reps;AAROM;Other (comment) with cane, 30 deg abduction    Flexion  AAROM;15 reps;Other (comment) with cane, pt able to reach 135 deg     ABduction  AAROM;15 reps;Other (comment);Left with cane, pt able to reach 120 deg       Shoulder Exercises: Seated   Retraction  Both;15 reps    Other Seated Exercises  UBE L1 x45min      Shoulder Exercises: Stretch   Other Shoulder Stretches  Lt shoulder extension stretch (active x10 reps, passive x5 reps fpr 15 sec)       Manual Therapy  Manual Therapy  Passive ROM    Joint Mobilization  Grade III Lt glenohumeral jt mobilization posterior, inferior directions     Soft tissue mobilization  Lt anterior deltoid/bicep    Passive ROM  Lt shoulder flexion and abduction x5 reps each (heavy guarding)              PT Education - 07/22/17 1125    Education provided  Yes    Education Details  importance of completing HEP atleast once daily; importance of finding a primary care physician in the area; discussed importance of attending sessions on time and progressing slowly as instructed rather than trying to lift dumbbells    Person(s) Educated  Patient    Methods  Explanation    Comprehension  Verbalized understanding       PT Short Term Goals - 06/30/17 0953      PT SHORT TERM GOAL #1   Title  Pt will demo consistency and independence with his HEP to increase his flexibility and decrease pain.     Status  Achieved      PT SHORT TERM GOAL #2   Title  Pt will demo improved mobility and awareness of proper body mechanics, evident by his ability to complete log roll when transferring on/off the mat table during his sessions without cues from the therapist.     Status  Achieved        PT Long  Term Goals - 07/07/17 16100828      PT LONG TERM GOAL #1   Title  Pt will be able to lift a 10# box with proper mechanics and without cuing from the therapist, ateast 5 reps, to allow for carry over into daily house/work activity.      Time  6    Period  Weeks    Status  On-going      PT LONG TERM GOAL #2   Title  Pt will demo improved BLE quadriceps flexibility, evident by atleast 100 deg of knee flexion during Ely's testing.     Period  Weeks    Status  On-going      PT LONG TERM GOAL #3   Title  Pt will report atleast 40% improvement in his low back pain from the start of therapy, to improve his quality of life and activity participation.     Time  6    Period  Weeks    Status  On-going      PT LONG TERM GOAL #4   Title  Pt will demo atleast 10% improvement on the Oswestry low back pain disability questionnaire to represent an improvement in his functional capacity.     Time  6    Period  Weeks    Status  On-going      PT LONG TERM GOAL #5   Title  improve quickdash score by 10 points (< or = to 38/100) to improve function of Lt UE    Time  6    Period  Weeks    Status  New    Target Date  08/18/17      Additional Long Term Goals   Additional Long Term Goals  Yes      PT LONG TERM GOAL #6   Title  demonstrate Lt shoulder A/ROM flexion to > or = to 130 to improve overhead reaching    Time  6    Period  Weeks    Status  New    Target Date  08/18/17      PT LONG TERM GOAL #7   Title  report < or = to 4/10 Lt shoulder pain with lifting and use with ADLs    Time  6    Period  Weeks    Status  New    Target Date  08/18/17            Plan - 07/22/17 1126    Clinical Impression Statement  Pt continues to note Lt shoulder and Lt sided low back pain. He felt some pain relief following the needling last session, but notes the pain has returned. Session focused on gentle Lt shoulder ROM and mobilizations in order to decrease pain and spasm. Pt was able to complete active  assisted Lt shoulder ROM into flexion 135 deg this session despite limited passive shoulder ROM prior. Ended session with slight ache in the shoulder. Therapist further instructed pt to avoid lifting weights and other strenuous activity at the gym to allow his shoulder to progress properly and prevent delayed healing. He verbalized understanding.     Rehab Potential  Fair    Clinical Impairments Affecting Rehab Potential  (+) pt appears to be highly motivated, (-) chronicity of symptoms    PT Frequency  2x / week    PT Duration  6 weeks    PT Treatment/Interventions  ADLs/Self Care Home Management;Cryotherapy;Electrical Stimulation;Moist Heat;Traction;Therapeutic exercise;Therapeutic activities;Functional mobility training;Neuromuscular re-education;Patient/family education;Manual techniques;Passive range of motion;Dry needling;Taping    PT Next Visit Plan  more dry needling; Gentle movements, stretches, low level core. Pain management. Lt shoulder-gentle flexibility and postural strength.      PT Home Exercise Plan  progress left shoulder HEP with wall slides, pendulums, scapula retraction    Consulted and Agree with Plan of Care  Patient       Patient will benefit from skilled therapeutic intervention in order to improve the following deficits and impairments:  Decreased activity tolerance, Impaired flexibility, Hypomobility, Decreased strength, Decreased range of motion, Postural dysfunction, Increased muscle spasms, Decreased mobility, Impaired sensation, Improper body mechanics, Pain  Visit Diagnosis: Chronic left-sided low back pain, with sciatica presence unspecified  Muscle spasm of back  Abnormal posture  Chronic left shoulder pain  Stiffness of left shoulder, not elsewhere classified     Problem List There are no active problems to display for this patient.  11:50 AM,07/22/17 Marylyn IshiharaSara Kiser PT, DPT [facility]  [phone]  [facility] [address] [place], [place], [address] Phone: [phone]   Fax:  [phone]  Name: Raymond MaineDaniel Dehn MRN: [id number] Date of Birth: 01-16-1966

## 2017-08-18 ENCOUNTER — Ambulatory Visit: Payer: Worker's Compensation | Attending: Physical Medicine and Rehabilitation | Admitting: Physical Therapy

## 2017-08-18 ENCOUNTER — Encounter: Payer: Self-pay | Admitting: Physical Therapy

## 2017-08-18 DIAGNOSIS — M25612 Stiffness of left shoulder, not elsewhere classified: Secondary | ICD-10-CM | POA: Insufficient documentation

## 2017-08-18 DIAGNOSIS — G8929 Other chronic pain: Secondary | ICD-10-CM

## 2017-08-18 DIAGNOSIS — R293 Abnormal posture: Secondary | ICD-10-CM | POA: Diagnosis present

## 2017-08-18 DIAGNOSIS — M62830 Muscle spasm of back: Secondary | ICD-10-CM | POA: Insufficient documentation

## 2017-08-18 DIAGNOSIS — M545 Low back pain: Secondary | ICD-10-CM | POA: Diagnosis present

## 2017-08-18 DIAGNOSIS — M25512 Pain in left shoulder: Secondary | ICD-10-CM | POA: Diagnosis present

## 2017-08-18 NOTE — Therapy (Addendum)
[facility] [address], [place], [place], [address] Phone: [phone]   Fax:  [phone]  Physical Therapy Treatment/Reassessment  Patient Details  Name: Raymond Nielsen MRN: [id number] Date of Birth: 11-Oct-1965 Referring Provider: Carney Harder. MD    Encounter Date: 08/18/2017  PT End of Session - 08/18/17 1325    Visit Number  8    Number of Visits  12    Date for PT Re-Evaluation  09/16/17    Authorization Type  worker's comp    Authorization Time Period  08/19/17 to 09/16/17    Authorization - Visit Number  8    Authorization - Number of Visits  12    PT Start Time  2563    PT Stop Time  1318    PT Time Calculation (min)  43 min    Activity Tolerance  No increased pain;Patient tolerated treatment well    Behavior During Therapy  [facility] for tasks assessed/performed       History reviewed. No pertinent past medical history.  History reviewed. No pertinent surgical history.  There were no vitals filed for this visit.  Subjective Assessment - 08/18/17 1238    Subjective  Pt reports that he was trying to set up an appointment with [facility], but was unable to get an appointment because they wanted information directly from his insurance company. He says that he has been on/off with his HEP, probably every other day. He was not completing his HEP over the past 2 days because he has been unpacking alot of boxes and everything. He does feel that his shoulder has improved about 25-30% since he started, but his back does not seem improved at all. He does think it will feel better when he leaves therapy sometimes, but overall it will end up bothering him when he leaves. He has tried changing his mattress and is using a lumbar support pillow but it will still bother him.     Pertinent History  body weight 206#    Limitations  Lifting    How long can you sit comfortably?  10-15 min     How long can you walk  comfortably?  unlimited     Patient Stated Goals  improve pain and ability to complete lifting for work.     Currently in Pain?  Yes    Pain Score  7     Pain Location  Back    Pain Orientation  Left;Lower    Pain Descriptors / Indicators  Constant    Pain Type  Chronic pain    Pain Radiating Towards  numbness lateral aspect of Lt thigh    Pain Onset  More than a month ago    Pain Frequency  Constant    Aggravating Factors   tying shoes/bending forward; getting into the car/bending over; lifting or sitting for long periods of time    Pain Relieving Factors  stretching/traction; stretches    Effect of Pain on Daily Activities  unable to complete activity around the house without assistance; not working     Pain Onset  More than a month ago         [facility] PT Assessment - 08/18/17 0001      Assessment   Medical Diagnosis  Lumbar spine, Lt shoulder     Referring Provider  Carney Harder. MD     Next MD Visit  none for now     Prior Therapy  a couple of weeks  Precautions   Precautions  None      Balance Screen   Has the patient fallen in the past 6 months  No    Has the patient had a decrease in activity level because of a fear of falling?   No    Is the patient reluctant to leave their home because of a fear of falling?   No      Home Film/video editor residence      Prior Function   Vocation  --    Vocation Requirements  currently not working      Cognition   Overall Cognitive Status  Within Functional Limits for tasks assessed      Observation/Other Assessments   Other Surveys   Other Surveys    Oswestry Disability Index   38% limited (moderate disability)  improved 10%    Quick DASH   65% disability       Sensation   Light Touch  Impaired by gross assessment    Additional Comments  Impaired sensation over Lt lateral thigh       Posture/Postural Control   Posture Comments  Pt sitting with Rt lateral shift, forward head/rounded shoulders,  increased thoracic kyphosis       AROM   Left Shoulder Flexion  108 Degrees (+) pain     Left Shoulder ABduction  100 Degrees    Lumbar Flexion  25% limited, use of UE to come back to neutral, pulling/cramp Lt low back    Lumbar Extension  50% limited, minimal pain reported end range but no change after 10 reps       Strength   Overall Strength Comments  --    Right/Left Shoulder  Left from 07/07/18 reassessment    Left Shoulder Flexion  4/5    Left Shoulder ABduction  4-/5    Left Shoulder Internal Rotation  4/5    Left Shoulder External Rotation  4/5      Flexibility   Soft Tissue Assessment /Muscle Length  yes    Quadriceps  (+) Ely's BLE, 90 deg     Piriformis  50% limited       Palpation   Spinal mobility  hypomobile throughout the lumbar spine     SI assessment   --    Palpation comment  muscle spasm/guarding lumbar paraspinals/multifidi      Special Tests    Special Tests  Lumbar    Lumbar Tests  Slump Test;Prone Knee Bend Test      Slump test   Findings  Positive    Side  Left      Prone Knee Bend Test   Findings  Negative      Transfers   Comments  Pt able to stand/sit without need for heavy UE use                   [facility] Adult PT Treatment/Exercise - 08/18/17 0001      Lumbar Exercises: Prone   Other Prone Lumbar Exercises  prone x3 min, increased to prone on elbows x2 min to increased lumbar ROM/focus on breathing             PT Education - 08/18/17 1405    Education provided  Yes    Education Details  importance of finding a PCP in town; importance of completing HEP daily rather than periodically; set up and use of lumbar roll; noted improvements in position tolerance and overall movement quality  compared to evaluation; POC moving forward and goals of finding management techniques rather than expecting to be pain free end of POC    Person(s) Educated  Patient    Methods  Explanation;Verbal cues    Comprehension  Verbalized  understanding;Returned demonstration       PT Short Term Goals - 08/18/17 1304      PT SHORT TERM GOAL #1   Title  Pt will demo consistency and independence with his HEP to increase his flexibility and decrease pain.     Baseline  every other day    Status  Partially Met      PT SHORT TERM GOAL #2   Title  Pt will demo improved mobility and awareness of proper body mechanics, evident by his ability to complete log roll when transferring on/off the mat table during his sessions without cues from the therapist.     Status  Achieved        PT Long Term Goals - 08/18/17 1305      PT LONG TERM GOAL #1   Title  Pt will be able to lift a 10# box with proper mechanics and without cuing from the therapist, ateast 5 reps, to allow for carry over into daily house/work activity.      Time  6    Period  Weeks    Status  On-going      PT LONG TERM GOAL #2   Title  Pt will demo improved BLE quadriceps flexibility, evident by atleast 100 deg of knee flexion during Ely's testing.     Baseline  90 deg     Period  Weeks    Status  On-going      PT LONG TERM GOAL #3   Title  Pt will report atleast 40% improvement in his low back pain from the start of therapy, to improve his quality of life and activity participation.     Baseline  25-30% improved Lt shoulder     Time  6    Period  Weeks    Status  On-going      PT LONG TERM GOAL #4   Title  Pt will demo atleast 10% improvement on the Oswestry low back pain disability questionnaire to represent an improvement in his functional capacity.     Time  6    Period  Weeks    Status  On-going      PT LONG TERM GOAL #5   Title  improve quickdash score by 10 points (< or = to 38/100) to improve function of Lt UE    Time  6    Period  Weeks    Status  New      PT LONG TERM GOAL #6   Title  demonstrate Lt shoulder A/ROM flexion to > or = to 130 to improve overhead reaching    Baseline  115 deg     Time  6    Period  Weeks    Status  On-going       PT LONG TERM GOAL #7   Title  report < or = to 4/10 Lt shoulder pain with lifting and use with ADLs    Time  6    Period  Weeks    Status  On-going            Plan - 08/18/17 1426    Clinical Impression Statement  Pt was reassessed this visit having made progress towards a couple of his goals.  Although pt feels that his back is minimally improved, he does demonstrate improved tolerance of positioning and testing during today's assessment. Overall, his oswestry low back index score has improved by 10% as well. Pt notes that his Lt shoulder is 25-30% improved today, although he continues to lack shoulder active ROM, up to 115 deg today and his Jeanie Cooks outcome assessment has gotten worse overall, indicating this method is likely not a reliable assessment of pt's progress. Pt has not been consistent with his HEP (completing "maybe every other day"), despite several attempts by the evaluating therapist to encourage adherence throughout his POC. He has responded well to dry needling and other treatments provided during therapy, although this does not appear to have much carry over into his daily activity. He continues to require assistance with lifting and has difficulty with forward bending activity, indicating a preference for extension based activity. Therapist did encourage pt to spend time in prone over the next several days, and educated him heavily on several issues with compliance, pain science and POC moving forward. Pt would benefit from extension of skilled PT intervention to address limitations in flexibility, ROM, strength and allow for transition into a home/gym program that he can complete at discharge to further decrease pain, improve strength and increase his independence with activity.     Rehab Potential  Fair    Clinical Impairments Affecting Rehab Potential  (+) pt appears to be highly motivated, (-) chronicity of symptoms    PT Frequency  2x / week    PT Duration  4 weeks     PT Treatment/Interventions  ADLs/Self Care Home Management;Cryotherapy;Electrical Stimulation;Moist Heat;Traction;Therapeutic exercise;Therapeutic activities;Functional mobility training;Neuromuscular re-education;Patient/family education;Manual techniques;Passive range of motion;Dry needling;Taping    PT Next Visit Plan  f/u on approved worker's comp visits; f/u on HEP adherence (prone time); f/u on PCP; pain science education.     PT Home Exercise Plan  progress left shoulder HEP with wall slides, pendulums, scapula retraction    Consulted and Agree with Plan of Care  Patient       Patient will benefit from skilled therapeutic intervention in order to improve the following deficits and impairments:  Decreased activity tolerance, Impaired flexibility, Hypomobility, Decreased strength, Decreased range of motion, Postural dysfunction, Increased muscle spasms, Decreased mobility, Impaired sensation, Improper body mechanics, Pain  Visit Diagnosis: Chronic left-sided low back pain, with sciatica presence unspecified  Muscle spasm of back  Abnormal posture  Chronic left shoulder pain  Stiffness of left shoulder, not elsewhere classified     Problem List There are no active problems to display for this patient.  2:39 PM,08/18/17 Sherol Dade PT, DPT [place] [facility] [place]  [facility] [address] [place], [place], [address] Phone: [phone]   Fax:  [phone]  Name: Raymond Nielsen MRN: [id number] Date of Birth: 09/21/1965   *addendum to include re-evaluation charge  3:03 PM,08/18/17 Sherol Dade PT, [place] [facility] [place]

## 2017-08-19 ENCOUNTER — Ambulatory Visit: Payer: Worker's Compensation | Admitting: Physical Therapy

## 2017-08-26 ENCOUNTER — Ambulatory Visit: Payer: Worker's Compensation | Admitting: Physical Therapy

## 2017-08-26 DIAGNOSIS — R293 Abnormal posture: Secondary | ICD-10-CM

## 2017-08-26 DIAGNOSIS — M545 Low back pain: Secondary | ICD-10-CM | POA: Diagnosis not present

## 2017-08-26 DIAGNOSIS — M62830 Muscle spasm of back: Secondary | ICD-10-CM

## 2017-08-26 DIAGNOSIS — M25612 Stiffness of left shoulder, not elsewhere classified: Secondary | ICD-10-CM

## 2017-08-26 DIAGNOSIS — G8929 Other chronic pain: Secondary | ICD-10-CM

## 2017-08-26 DIAGNOSIS — M25512 Pain in left shoulder: Secondary | ICD-10-CM

## 2017-08-26 NOTE — Patient Instructions (Signed)
  Back - Prone over pillow  Lie on stomach with a pillow positioned underneath your hips.  x5 min every day. If pain eases up, remove the pillow and try to lay on stomach for 2 more minutes.     LOWER TRUNK ROTATIONS - LTR  Lying on your back with your knees bent, gently rock your knees side-to-side. x20 reps only to the right side.    [facility] [address], [address] [place], [place] [address] Phone # [phone] Fax [phone]

## 2017-08-26 NOTE — Therapy (Signed)
[facility] [address] [place], [place], [address] Phone: [phone]   Fax:  [phone]  Physical Therapy Treatment  Patient Details  Name: Raymond Nielsen MRN: [id number] Date of Birth: 1966-07-02 Referring Provider: Carney Harder. MD    Encounter Date: 08/26/2017  PT End of Session - 08/26/17 1243    Visit Number  9    Number of Visits  12    Date for PT Re-Evaluation  09/16/17    Authorization Type  worker's comp    Authorization Time Period  08/19/17 to 09/16/17    Authorization - Visit Number  9    Authorization - Number of Visits  12    PT Start Time  1230    PT Stop Time  1312    PT Time Calculation (min)  42 min    Activity Tolerance  No increased pain;Patient tolerated treatment well    Behavior During Therapy  [facility] for tasks assessed/performed       No past medical history on file.  No past surgical history on file.  There were no vitals filed for this visit.  Subjective Assessment - 08/26/17 1231    Subjective  Pt reports that he has not heard back from [facility]. He tried calling his caseworker this morning about trying to get a primary care physician. He has about 6 out of 10 pain in the low back upon arrival. He has not been laying on his stomach because his back has been bothering him, but he has been trying to bend forward and touch his toes.     Pertinent History  body weight 206#    Limitations  Lifting    How long can you sit comfortably?  10-15 min     How long can you walk comfortably?  unlimited     Patient Stated Goals  improve pain and ability to complete lifting for work.     Currently in Pain?  Yes    Pain Score  6     Pain Location  Back    Pain Orientation  Left;Lower    Pain Descriptors / Indicators  Constant    Pain Type  Chronic pain    Pain Radiating Towards  no pain radiating    Pain Onset  More than a month ago    Pain Frequency  Constant    Aggravating Factors    bending forward/ getting in the car; lifting     Pain Relieving Factors  unsure     Effect of Pain on Daily Activities  severe limitation    Multiple Pain Sites  No    Pain Onset  More than a month ago                      [facility] Adult PT Treatment/Exercise - 08/26/17 0001      Lumbar Exercises: Aerobic   Stationary Bike  Nustep L3 x5 min, lumbar roll in low back  PT present to encourage proper posture      Lumbar Exercises: Standing   Other Standing Lumbar Exercises  NBOS on foam with blue weighted ball: hip Lt/Rt tap x20 reps, shoulder tap Lt/Rt x20 reps, diagonals hip/shoulder x20 reps each side      Lumbar Exercises: Supine   Other Supine Lumbar Exercises  low trunk rotation Rt only x20 reps       Lumbar Exercises: Prone   Other Prone Lumbar Exercises  prone over 2 pillows  x2 min, prone over 1 pillow x3 min, pt unable to tolerate prone without pillows for more than 1 min.       Manual Therapy   Joint Mobilization  Grade II-III CPAs lumbar spine, prone over 2 pillows              PT Education - 08/26/17 1253    Education provided  Yes    Education Details  importance of focusing on HEP provided by therapist; provided new handout to ensure pt understanding     Person(s) Educated  Patient    Methods  Explanation;Handout    Comprehension  Returned demonstration;Verbalized understanding       PT Short Term Goals - 08/18/17 1304      PT SHORT TERM GOAL #1   Title  Pt will demo consistency and independence with his HEP to increase his flexibility and decrease pain.     Baseline  every other day    Status  Partially Met      PT SHORT TERM GOAL #2   Title  Pt will demo improved mobility and awareness of proper body mechanics, evident by his ability to complete log roll when transferring on/off the mat table during his sessions without cues from the therapist.     Status  Achieved        PT Long Term Goals - 08/18/17 1305      PT LONG TERM GOAL #1    Title  Pt will be able to lift a 10# box with proper mechanics and without cuing from the therapist, ateast 5 reps, to allow for carry over into daily house/work activity.      Time  6    Period  Weeks    Status  On-going      PT LONG TERM GOAL #2   Title  Pt will demo improved BLE quadriceps flexibility, evident by atleast 100 deg of knee flexion during Ely's testing.     Baseline  90 deg     Period  Weeks    Status  On-going      PT LONG TERM GOAL #3   Title  Pt will report atleast 40% improvement in his low back pain from the start of therapy, to improve his quality of life and activity participation.     Baseline  25-30% improved Lt shoulder     Time  6    Period  Weeks    Status  On-going      PT LONG TERM GOAL #4   Title  Pt will demo atleast 10% improvement on the Oswestry low back pain disability questionnaire to represent an improvement in his functional capacity.     Time  6    Period  Weeks    Status  On-going      PT LONG TERM GOAL #5   Title  improve quickdash score by 10 points (< or = to 38/100) to improve function of Lt UE    Time  6    Period  Weeks    Status  New      PT LONG TERM GOAL #6   Title  demonstrate Lt shoulder A/ROM flexion to > or = to 130 to improve overhead reaching    Baseline  115 deg     Time  6    Period  Weeks    Status  On-going      PT LONG TERM GOAL #7   Title  report < or =  to 4/10 Lt shoulder pain with lifting and use with ADLs    Time  6    Period  Weeks    Status  On-going            Plan - 08/26/17 1311    Clinical Impression Statement  Pt arrived today with continued low back pain, reporting non-adherence to the HEP discussed at his last session. Pt continues to lack understanding of his HEP, despite therapist efforts to clarify with handouts/verbal instruction in the past. A second handout was provided with the hope of encouraging improved HEP adherence and therapist had pt verbally demonstrate understanding of his  exercises at the end of today's session. Otherwise, session focused on improving lumbar mobility and core activation with upright activity. Pt reported no specific increase in his pain end of session, despite the increase in activities performed compared to prior sessions.    Rehab Potential  Fair    Clinical Impairments Affecting Rehab Potential  (+) pt appears to be highly motivated, (-) chronicity of symptoms    PT Frequency  2x / week    PT Duration  4 weeks    PT Treatment/Interventions  ADLs/Self Care Home Management;Cryotherapy;Electrical Stimulation;Moist Heat;Traction;Therapeutic exercise;Therapeutic activities;Functional mobility training;Neuromuscular re-education;Patient/family education;Manual techniques;Passive range of motion;Dry needling;Taping    PT Next Visit Plan   f/u on HEP adherence (prone time and low trunk rotation to the Rt); possible pain science education and relaxation techniques; gentle lumbar ROM, core work    PT Home Exercise Plan  prone over 1 pillow, low trunk rotation to the Rt     Consulted and Agree with Plan of Care  Patient       Patient will benefit from skilled therapeutic intervention in order to improve the following deficits and impairments:  Decreased activity tolerance, Impaired flexibility, Hypomobility, Decreased strength, Decreased range of motion, Postural dysfunction, Increased muscle spasms, Decreased mobility, Impaired sensation, Improper body mechanics, Pain  Visit Diagnosis: Chronic left-sided low back pain, with sciatica presence unspecified  Muscle spasm of back  Abnormal posture  Chronic left shoulder pain  Stiffness of left shoulder, not elsewhere classified     Problem List There are no active problems to display for this patient.   1:47 PM,08/26/17 Sherol Dade PT, [place] at [place]  [facility] [address],  [place], [place], [address] Phone: [phone]   Fax:  [phone]  Name: Raymond Nielsen MRN: [id number] Date of Birth: 14-Nov-1965

## 2017-09-03 ENCOUNTER — Encounter: Payer: Self-pay | Admitting: Physical Therapy

## 2017-09-03 ENCOUNTER — Ambulatory Visit: Payer: Worker's Compensation | Admitting: Physical Therapy

## 2017-09-03 DIAGNOSIS — M25612 Stiffness of left shoulder, not elsewhere classified: Secondary | ICD-10-CM

## 2017-09-03 DIAGNOSIS — M25512 Pain in left shoulder: Secondary | ICD-10-CM

## 2017-09-03 DIAGNOSIS — M545 Low back pain: Secondary | ICD-10-CM | POA: Diagnosis not present

## 2017-09-03 DIAGNOSIS — G8929 Other chronic pain: Secondary | ICD-10-CM

## 2017-09-03 DIAGNOSIS — R293 Abnormal posture: Secondary | ICD-10-CM

## 2017-09-03 DIAGNOSIS — M62830 Muscle spasm of back: Secondary | ICD-10-CM

## 2017-09-03 NOTE — Patient Instructions (Signed)
   PELVIC TILT - SEATED  While in a seated position, arch your low back and then flatten it  repeatedly. Your pelvis should tilt forward and back during the movement, but the upper body should remain still. Move through a comfortable range of motion.  x20 reps each direction.     [facility] [address], [address] [place], [place] [address] Phone # [phone] Fax [phone]

## 2017-09-03 NOTE — Therapy (Signed)
[facility] [address], [place], [place], [address] Phone: [phone]   Fax:  [phone]  Physical Therapy Treatment  Patient Details  Name: Raymond Nielsen MRN: [id number] Date of Birth: [birth year] Referring Provider: Carney Harder. MD    Encounter Date: 09/03/2017  PT End of Session - 09/03/17 1555    Visit Number  10    Number of Visits  12    Date for PT Re-Evaluation  09/16/17    Authorization Type  worker's comp    Authorization Time Period  08/19/17 to 09/16/17    Authorization - Visit Number  10    Authorization - Number of Visits  12    PT Start Time  7867    PT Stop Time  1610    PT Time Calculation (min)  42 min    Activity Tolerance  No increased pain;Patient tolerated treatment well    Behavior During Therapy  [facility] for tasks assessed/performed       History reviewed. No pertinent past medical history.  History reviewed. No pertinent surgical history.  There were no vitals filed for this visit.  Subjective Assessment - 09/03/17 1530    Subjective  Pt reports that he heard back from his workers comp and no one will accept his workers comp for payment. He was completing his HEP but can't remember all of them. He says he was laying on his stomach with one pillow and his shoulder would start to bother him.     Pertinent History  body weight 206#    Limitations  Lifting    How long can you sit comfortably?  10-15 min     How long can you walk comfortably?  unlimited     Patient Stated Goals  improve pain and ability to complete lifting for work.     Currently in Pain?  Other (Comment) no pain report given.     Pain Onset  More than a month ago    Pain Onset  More than a month ago           [facility] Adult PT Treatment/Exercise - 09/03/17 0001      Lumbar Exercises: Aerobic   Stationary Bike  Recumbent bike x8 min, L2 PT present 5 min to discuss HEP adherence and PCP      Lumbar Exercises: Supine   Heel Slides  15 reps;Limitations    Heel Slides Limitations  with abdominal brace, Lt and Rt     Bent Knee Raise  10 reps alternating Lt/Rt LE with horizontal abduction (yellow)    Other Supine Lumbar Exercises  low trunk rotation Rt only x20 reps; supine bent knee fall out with UE AAROM into flexion x15 reps each     Other Supine Lumbar Exercises  B knees to chest with LE resting on red physioball x20 reps; anterior and posterior pelvic tilts with mirror feedback x20 reps             PT Education - 09/03/17 1551    Education provided  Yes    Education Details  benefits of [place] in the [place]; adjustments to prone HEP    Person(s) Educated  Patient    Methods  Explanation;Handout    Comprehension  Verbalized understanding;Returned demonstration       PT Short Term Goals - 08/18/17 1304      PT SHORT TERM GOAL #1   Title  Pt will demo consistency and independence with his HEP to  increase his flexibility and decrease pain.     Baseline  every other day    Status  Partially Met      PT SHORT TERM GOAL #2   Title  Pt will demo improved mobility and awareness of proper body mechanics, evident by his ability to complete log roll when transferring on/off the mat table during his sessions without cues from the therapist.     Status  Achieved        PT Long Term Goals - 08/18/17 1305      PT LONG TERM GOAL #1   Title  Pt will be able to lift a 10# box with proper mechanics and without cuing from the therapist, ateast 5 reps, to allow for carry over into daily house/work activity.      Time  6    Period  Weeks    Status  On-going      PT LONG TERM GOAL #2   Title  Pt will demo improved BLE quadriceps flexibility, evident by atleast 100 deg of knee flexion during Ely's testing.     Baseline  90 deg     Period  Weeks    Status  On-going      PT LONG TERM GOAL #3   Title  Pt will report atleast 40% improvement in his low back pain from the start of therapy, to improve his  quality of life and activity participation.     Baseline  25-30% improved Lt shoulder     Time  6    Period  Weeks    Status  On-going      PT LONG TERM GOAL #4   Title  Pt will demo atleast 10% improvement on the Oswestry low back pain disability questionnaire to represent an improvement in his functional capacity.     Time  6    Period  Weeks    Status  On-going      PT LONG TERM GOAL #5   Title  improve quickdash score by 10 points (< or = to 38/100) to improve function of Lt UE    Time  6    Period  Weeks    Status  New      PT LONG TERM GOAL #6   Title  demonstrate Lt shoulder A/ROM flexion to > or = to 130 to improve overhead reaching    Baseline  115 deg     Time  6    Period  Weeks    Status  On-going      PT LONG TERM GOAL #7   Title  report < or = to 4/10 Lt shoulder pain with lifting and use with ADLs    Time  6    Period  Weeks    Status  On-going            Plan - 09/03/17 1559    Clinical Impression Statement  Pt continues to have guarded movement with therex, however he was able to complete several new activities this session incorporating UE/LE movements. Pt did require intermittent rest breaks due to reports of increased Lt shoulder fatigue/discomfort, and he has increased difficulty coordinating anterior and posterior pelvic tilts. This improved some with mirror feedback. Ended session with further encouragement and adjustments to HEP for improved adherence. Pt verbalized understanding.     Rehab Potential  Fair    Clinical Impairments Affecting Rehab Potential  (+) pt appears to be highly motivated, (-) chronicity of symptoms  PT Frequency  2x / week    PT Duration  4 weeks    PT Treatment/Interventions  ADLs/Self Care Home Management;Cryotherapy;Electrical Stimulation;Moist Heat;Traction;Therapeutic exercise;Therapeutic activities;Functional mobility training;Neuromuscular re-education;Patient/family education;Manual techniques;Passive range of  motion;Dry needling;Taping    PT Next Visit Plan   f/u on HEP adherence (prone time, pelvic tilts and low trunk rotation to the Rt); possible pain science education and relaxation techniques; gentle lumbar ROM, core work    PT Home Exercise Plan  prone over 1 pillow, low trunk rotation to the Rtm, seated pelvic tilt    Consulted and Agree with Plan of Care  Patient       Patient will benefit from skilled therapeutic intervention in order to improve the following deficits and impairments:  Decreased activity tolerance, Impaired flexibility, Hypomobility, Decreased strength, Decreased range of motion, Postural dysfunction, Increased muscle spasms, Decreased mobility, Impaired sensation, Improper body mechanics, Pain  Visit Diagnosis: Chronic left-sided low back pain, with sciatica presence unspecified  Muscle spasm of back  Abnormal posture  Chronic left shoulder pain  Stiffness of left shoulder, not elsewhere classified     Problem List There are no active problems to display for this patient.   4:15 PM,09/03/17 [place], [place] at [place]  [facility] [address], [place], [place], [address] Phone: [phone]   Fax:  [phone]  Name: Raymond Nielsen MRN: [id number] Date of Birth: 23-Nov-1965

## 2017-09-09 ENCOUNTER — Encounter: Payer: Self-pay | Admitting: Physical Therapy

## 2017-09-09 ENCOUNTER — Ambulatory Visit: Payer: Worker's Compensation | Attending: Physical Medicine and Rehabilitation | Admitting: Physical Therapy

## 2017-09-09 DIAGNOSIS — M545 Low back pain: Secondary | ICD-10-CM | POA: Diagnosis present

## 2017-09-09 DIAGNOSIS — M25512 Pain in left shoulder: Secondary | ICD-10-CM | POA: Insufficient documentation

## 2017-09-09 DIAGNOSIS — M25612 Stiffness of left shoulder, not elsewhere classified: Secondary | ICD-10-CM | POA: Diagnosis present

## 2017-09-09 DIAGNOSIS — G8929 Other chronic pain: Secondary | ICD-10-CM | POA: Diagnosis present

## 2017-09-09 DIAGNOSIS — M62830 Muscle spasm of back: Secondary | ICD-10-CM | POA: Diagnosis present

## 2017-09-09 DIAGNOSIS — R293 Abnormal posture: Secondary | ICD-10-CM | POA: Insufficient documentation

## 2017-09-09 NOTE — Therapy (Signed)
[facility] [address] [place], [place], [address] Phone: [phone]   Fax:  [phone]  Physical Therapy Treatment  Patient Details  Name: Raymond Nielsen MRN: [id number] Date of Birth: 10-Aug-1965 Referring Provider: Carney Harder. MD    Encounter Date: 09/09/2017  PT End of Session - 09/09/17 1534    Visit Number  11    Number of Visits  12    Date for PT Re-Evaluation  09/16/17    Authorization Type  worker's comp    Authorization Time Period  08/19/17 to 09/16/17    Authorization - Visit Number  11    Authorization - Number of Visits  12    PT Start Time  4917    PT Stop Time  9150    PT Time Calculation (min)  42 min    Activity Tolerance  No increased pain;Patient tolerated treatment well    Behavior During Therapy  [facility] for tasks assessed/performed       History reviewed. No pertinent past medical history.  History reviewed. No pertinent surgical history.  There were no vitals filed for this visit.  Subjective Assessment - 09/09/17 1524    Subjective  Pt reports that things are going ok. He says his HEP is going well, he got a thin weight lifting belt that he has been trying to use which seems to help his back some.     Pertinent History  body weight 206#    Limitations  Lifting    How long can you sit comfortably?  10-15 min     How long can you walk comfortably?  unlimited     Patient Stated Goals  improve pain and ability to complete lifting for work.     Currently in Pain?  Yes    Pain Score  7     Pain Location  Back    Pain Orientation  Left;Lower    Pain Descriptors / Indicators  Constant "just uncomfortable"    Pain Type  Chronic pain    Pain Radiating Towards  no radiating pain     Pain Onset  More than a month ago    Pain Frequency  Constant    Aggravating Factors   coughing, stairs, lifting, alot of forward bending    Pain Relieving Factors  belt seems to help some    Pain Onset  More than  a month ago                      [facility] Adult PT Treatment/Exercise - 09/09/17 0001      Lumbar Exercises: Aerobic   Stationary Bike  L 3 x5 min, PT present to discuss       Lumbar Exercises: Standing   Wall Slides  10 reps;Limitations    Wall Slides Limitations  focus on proper posture and breathing      Lumbar Exercises: Seated   Other Seated Lumbar Exercises  seated trunk rotation xx10 reps, 5 sec hold each      Lumbar Exercises: Supine   Other Supine Lumbar Exercises  hip extension isometric into table x10 reps each, hip extension isometric in 90/90 position x10 reps each     Other Supine Lumbar Exercises      Shoulder Exercises: Supine   Other Supine Exercises  cervical retraction x15 reps     Other Supine Exercises  cervical rotation Lt/Rt x15 reps each with slight chin tuck  Shoulder Exercises: Seated   Other Seated Exercises  Cervical rotation mobilization with movement 2x10 reps to the Lt and x15 reps to the Rt              PT Education - 09/09/17 1606    Education provided  Yes    Education Details  anatomy of the spine and importance of moving into discomfort to some degree to allow him to strengthen muscles and maintain mobility; addition to HEP    Person(s) Educated  Patient    Methods  Explanation;Handout    Comprehension  Verbalized understanding;Returned demonstration       PT Short Term Goals - 08/18/17 1304      PT SHORT TERM GOAL #1   Title  Pt will demo consistency and independence with his HEP to increase his flexibility and decrease pain.     Baseline  every other day    Status  Partially Met      PT SHORT TERM GOAL #2   Title  Pt will demo improved mobility and awareness of proper body mechanics, evident by his ability to complete log roll when transferring on/off the mat table during his sessions without cues from the therapist.     Status  Achieved        PT Long Term Goals - 08/18/17 1305      PT LONG TERM GOAL #1    Title  Pt will be able to lift a 10# box with proper mechanics and without cuing from the therapist, ateast 5 reps, to allow for carry over into daily house/work activity.      Time  6    Period  Weeks    Status  On-going      PT LONG TERM GOAL #2   Title  Pt will demo improved BLE quadriceps flexibility, evident by atleast 100 deg of knee flexion during Ely's testing.     Baseline  90 deg     Period  Weeks    Status  On-going      PT LONG TERM GOAL #3   Title  Pt will report atleast 40% improvement in his low back pain from the start of therapy, to improve his quality of life and activity participation.     Baseline  25-30% improved Lt shoulder     Time  6    Period  Weeks    Status  On-going      PT LONG TERM GOAL #4   Title  Pt will demo atleast 10% improvement on the Oswestry low back pain disability questionnaire to represent an improvement in his functional capacity.     Time  6    Period  Weeks    Status  On-going      PT LONG TERM GOAL #5   Title  improve quickdash score by 10 points (< or = to 38/100) to improve function of Lt UE    Time  6    Period  Weeks    Status  New      PT LONG TERM GOAL #6   Title  demonstrate Lt shoulder A/ROM flexion to > or = to 130 to improve overhead reaching    Baseline  115 deg     Time  6    Period  Weeks    Status  On-going      PT LONG TERM GOAL #7   Title  report < or = to 4/10 Lt shoulder pain with lifting and  use with ADLs    Time  6    Period  Weeks    Status  On-going            Plan - 09/09/17 1609    Clinical Impression Statement  Pt continues to report high levels of low back pain. He is usually unable to walk more than about 15 minutes without a break. Session focused on gentle isometrics and ROM exercises of the upper and lower quarter, however this continues to be painful for him. Made an addition to pt's HEP to encourage isometric muscle activation of the hip and he demonstrated understanding of proper  technique. Pt's re-evaluation is coming up and due to lack of progress, will likely consider d/c back to referring physician for further evaluation.     Rehab Potential  Fair    Clinical Impairments Affecting Rehab Potential  (+) pt appears to be highly motivated, (-) chronicity of symptoms    PT Frequency  2x / week    PT Duration  4 weeks    PT Treatment/Interventions  ADLs/Self Care Home Management;Cryotherapy;Electrical Stimulation;Moist Heat;Traction;Therapeutic exercise;Therapeutic activities;Functional mobility training;Neuromuscular re-education;Patient/family education;Manual techniques;Passive range of motion;Dry needling;Taping    PT Next Visit Plan  re-eval, possible d/c     PT Home Exercise Plan  prone over 1 pillow, low trunk rotation to the Rtm, seated pelvic tilt    Consulted and Agree with Plan of Care  Patient       Patient will benefit from skilled therapeutic intervention in order to improve the following deficits and impairments:  Decreased activity tolerance, Impaired flexibility, Hypomobility, Decreased strength, Decreased range of motion, Postural dysfunction, Increased muscle spasms, Decreased mobility, Impaired sensation, Improper body mechanics, Pain  Visit Diagnosis: Chronic left-sided low back pain, with sciatica presence unspecified  Muscle spasm of back  Abnormal posture  Chronic left shoulder pain  Stiffness of left shoulder, not elsewhere classified     Problem List There are no active problems to display for this patient.   4:13 PM,09/09/17 [place], DPT [place] [facility] [place]  [facility] [address] [place], [place], [address] Phone: [phone]   Fax:  [phone]  Name: Raymond Nielsen MRN: [id number] Date of Birth: 1966/07/18

## 2017-09-09 NOTE — Patient Instructions (Signed)
   Isometric Hip Extension   While lying on your back, place your leg into a chair position and pull away against your hands.   Hold 5 sec, repeat 10x.    [facility] [address], [address] [place], [place] [address] Phone # [phone] Fax [phone]

## 2017-09-16 ENCOUNTER — Ambulatory Visit: Payer: Worker's Compensation | Admitting: Physical Therapy

## 2017-09-16 ENCOUNTER — Encounter: Payer: Self-pay | Admitting: Physical Therapy

## 2017-09-16 DIAGNOSIS — M62830 Muscle spasm of back: Secondary | ICD-10-CM

## 2017-09-16 DIAGNOSIS — M545 Low back pain: Secondary | ICD-10-CM | POA: Diagnosis not present

## 2017-09-16 DIAGNOSIS — M25612 Stiffness of left shoulder, not elsewhere classified: Secondary | ICD-10-CM

## 2017-09-16 DIAGNOSIS — G8929 Other chronic pain: Secondary | ICD-10-CM

## 2017-09-16 DIAGNOSIS — R293 Abnormal posture: Secondary | ICD-10-CM

## 2017-09-16 DIAGNOSIS — M25512 Pain in left shoulder: Secondary | ICD-10-CM

## 2017-09-16 NOTE — Therapy (Signed)
[facility] [address] [place], [place], [address] Phone: [phone]   Fax:  [phone]  Physical Therapy Treatment/Discharge  Patient Details  Name: Raymond Nielsen MRN: [id number] Date of Birth: [birth year] Referring Provider: Carley Hammed, MD    Encounter Date: 09/16/2017  PT End of Session - 09/16/17 1025    Visit Number  12    Number of Visits  12    Date for PT Re-Evaluation  09/16/17    Authorization Type  worker's comp    Authorization Time Period  08/19/17 to 09/16/17    Authorization - Visit Number  12    Authorization - Number of Visits  12    PT Start Time  0935    PT Stop Time  1015    PT Time Calculation (min)  40 min    Activity Tolerance  No increased pain;Patient tolerated treatment well    Behavior During Therapy  [facility] for tasks assessed/performed       History reviewed. No pertinent past medical history.  History reviewed. No pertinent surgical history.  There were no vitals filed for this visit.  Subjective Assessment - 09/16/17 0936    Subjective  Pt reports that things are the same. He has not been laying on his stomach because that is very uncomfortable for him, but he has been doing the other exercises. Pt reports that he has not noticed any improvement in his back since beginning PT. He states that sometimes it is ok, but other times it is not good at all. Walking and standing really bother it, but he still tries to push through the pain.     Pertinent History  body weight 206#    Limitations  Lifting    How long can you sit comfortably?  maybe 30 min    How long can you stand comfortably?  about 1 hour     How long can you walk comfortably?  about 1 hour    Patient Stated Goals  improve pain and ability to complete lifting for work.     Currently in Pain?  Yes    Pain Score  7     Pain Location  Back    Pain Orientation  Left;Lower    Pain Descriptors / Indicators  Constant    Pain Type   Chronic pain    Pain Radiating Towards  numbness Lt thigh area, no pain radiating    Pain Onset  More than a month ago    Pain Frequency  Constant    Aggravating Factors   alot of standing, walking    Pain Relieving Factors  just the medication for a little bit, sometimes moving to different positions will help     Effect of Pain on Daily Activities  difficulty with daily activity     Multiple Pain Sites  Yes    Pain Score  5    Pain Location  Shoulder    Pain Orientation  Left    Pain Descriptors / Indicators  Aching    Pain Type  Chronic pain    Pain Radiating Towards  no radiating pain, but pt states he has "tightness" in his hands    Pain Onset  More than a month ago    Pain Frequency  Constant    Aggravating Factors   laying on a pillow on his back, lifting things    Pain Relieving Factors  rest and gentle movements  [facility] PT Assessment - 09/16/17 0001      Assessment   Medical Diagnosis  Lumbar spine, Lt shoulder     Referring Provider  Carley Hammed, MD     Next MD Visit  none for now     Prior Therapy  a couple of weeks       Precautions   Precautions  None      Balance Screen   Has the patient fallen in the past 6 months  No    Has the patient had a decrease in activity level because of a fear of falling?   No    Is the patient reluctant to leave their home because of a fear of falling?   No      Home Film/video editor residence      Prior Function   Vocation Requirements  currently not working      Cognition   Overall Cognitive Status  Within Functional Limits for tasks assessed      Observation/Other Assessments   Other Surveys   Other Surveys    Oswestry Disability Index   36% limited (moderate disability)  minimal improvement    Quick DASH   42.5 5 pt improvement from 07/07/17      Sensation   Light Touch  Impaired by gross assessment    Additional Comments  Impaired sensation over Lt lateral thigh       Posture/Postural  Control   Posture Comments  Pt sitting slouched to the Rt       AROM   Left Shoulder Flexion  115 Degrees (+) pain     Left Shoulder ABduction  106 Degrees (+) pain     Lumbar Flexion  25%-50% limited, use of UE to come back to neutral, pulling/cramp Lt low back    Lumbar Extension  50% limited, pain end range (x10 reps)  no change      Strength   Strength Assessment Site  Hip;Knee;Ankle    Left Shoulder Flexion  4/5    Left Shoulder ABduction  4-/5    Left Shoulder Internal Rotation  4/5    Left Shoulder External Rotation  4/5    Right/Left Hip  Right;Left    Right Hip Flexion  4-/5    Right Hip Extension  5/5    Right Hip ABduction  4/5    Left Hip Flexion  4-/5    Left Hip Extension  5/5    Left Hip ABduction  4/5    Right/Left Knee  -- BLE 5/5     Right/Left Ankle  -- BLE 5/5       Flexibility   Soft Tissue Assessment /Muscle Length  yes    Quadriceps  (+) Ely's BLE, 90 deg     Piriformis  50% limited       Palpation   Spinal mobility  hypomobile throughout the lumbar spine     Palpation comment  muscle spasm/guarding lumbar paraspinals/multifidi      Special Tests    Special Tests  Lumbar    Lumbar Tests  Slump Test;Prone Knee Bend Test      Slump test   Findings  Positive    Side  Left      Prone Knee Bend Test   Findings  Positive    Side  Left      Transfers   Comments  Pt able to stand/sit without need for heavy UE use  PT Education - 09/16/17 1015    Education provided  Yes    Education Details  reassesment findings and d/c due to lack of progress since starting PT; impact psychosocial aspect of the body has on low back pain; discussed movement adjustments to improve activity completion; provided information on progressive muscle relaxation techniques for home    Person(s) Educated  Patient    Methods  Explanation;Handout    Comprehension  Verbalized understanding       PT Short Term Goals - 09/16/17  0953      PT SHORT TERM GOAL #1   Title  Pt will demo consistency and independence with his HEP to increase his flexibility and decrease pain.     Baseline  every other day    Status  Partially Met      PT SHORT TERM GOAL #2   Title  Pt will demo improved mobility and awareness of proper body mechanics, evident by his ability to complete log roll when transferring on/off the mat table during his sessions without cues from the therapist.     Status  Achieved        PT Long Term Goals - 09/16/17 0953      PT LONG TERM GOAL #1   Title  Pt will be able to lift a 10# box with proper mechanics and without cuing from the therapist, ateast 5 reps, to allow for carry over into daily house/work activity.      Time  6    Period  Weeks    Status  Not Met      PT LONG TERM GOAL #2   Title  Pt will demo improved BLE quadriceps flexibility, evident by atleast 100 deg of knee flexion during Ely's testing.     Baseline  90 deg     Period  Weeks    Status  Not Met      PT LONG TERM GOAL #3   Title  Pt will report atleast 40% improvement in his low back pain from the start of therapy, to improve his quality of life and activity participation.     Baseline  25-30% improved Lt shoulder; no improvement in the low back (still on and off)    Time  6    Period  Weeks    Status  Not Met      PT LONG TERM GOAL #4   Title  Pt will demo atleast 10% improvement on the Oswestry low back pain disability questionnaire to represent an improvement in his functional capacity.     Time  6    Period  Weeks    Status  Not Met      PT LONG TERM GOAL #5   Title  improve quickdash score by 10 points (< or = to 38/100) to improve function of Lt UE    Time  6    Period  Weeks    Status  Not Met      PT LONG TERM GOAL #6   Title  demonstrate Lt shoulder A/ROM flexion to > or = to 130 to improve overhead reaching    Baseline  115 deg     Time  6    Period  Weeks    Status  Not Met      PT LONG TERM GOAL #7    Title  report < or = to 4/10 Lt shoulder pain with lifting and use with ADLs    Baseline  8  or 9 average pain rating during daily activity    Time  6    Period  Weeks    Status  Not Met            Plan - 09/16/17 1158    Clinical Impression Statement  Pt was discharged this visit having made minimal progress towards his goals since his evaluation on 06/10/18. Pt's score was only minimally improved on the Oswestry low back index and DASH outcome measures. His Lt shoulder ROM has improved 5-10 deg overall to 105 and 115 deg, however his strength remains 4/5 MMT with pain during resistance and active motion. Overall, pt has been inconsistent with HEP understanding and completion, despite therapist consistent efforts to educate, review and urge regular adherence. He continues to require heavy encouragement to complete even the most gentle of lumbar/shoulder movements, and this has been unable to be progressed over the course of his treatment. His response to manual techniques, traction, therapeutic activity and exercise has been mixed, thus limiting therapist's ability to see positive improvements in his pain/mobility end of session. Therapist discussed d/c from PT with the pt who verbalized understanding at this time. Pt was encouraged to utilize progressive muscle relaxation techniques and continue with his HEP moving forward in addition to other education provided of which he confirmed understanding. PT will sign off.     Rehab Potential  Fair    Clinical Impairments Affecting Rehab Potential  (+) pt appears to be highly motivated, (-) chronicity of symptoms    PT Frequency  2x / week    PT Duration  4 weeks    PT Treatment/Interventions  ADLs/Self Care Home Management;Cryotherapy;Electrical Stimulation;Moist Heat;Traction;Therapeutic exercise;Therapeutic activities;Functional mobility training;Neuromuscular re-education;Patient/family education;Manual techniques;Passive range of motion;Dry  needling;Taping    PT Next Visit Plan  d/c home     PT Home Exercise Plan  prone over 1 pillow, low trunk rotation to the Rt, seated pelvic tilt    Consulted and Agree with Plan of Care  Patient       Patient will benefit from skilled therapeutic intervention in order to improve the following deficits and impairments:  Decreased activity tolerance, Impaired flexibility, Hypomobility, Decreased strength, Decreased range of motion, Postural dysfunction, Increased muscle spasms, Decreased mobility, Impaired sensation, Improper body mechanics, Pain  Visit Diagnosis: Chronic left-sided low back pain, with sciatica presence unspecified  Muscle spasm of back  Abnormal posture  Chronic left shoulder pain  Stiffness of left shoulder, not elsewhere classified    PHYSICAL THERAPY DISCHARGE SUMMARY  Visits from Start of Care: 12  Current functional level related to goals / functional outcomes: See above for more details    Remaining deficits: See above for more details    Education / Equipment: See above for more details   Plan: Patient agrees to discharge.  Patient goals were not met. Patient is being discharged due to lack of progress.  ?????       Problem List There are no active problems to display for this patient.   12:10 PM,09/16/17 Sherol Dade PT, [facility] [address] [place], [place], [address] Phone: [phone]   Fax:  [phone]  Name: Raymond Nielsen MRN: [id number] Date of Birth: Sep 15, 1965

## 2017-09-30 ENCOUNTER — Emergency Department (HOSPITAL_COMMUNITY): Payer: Worker's Compensation

## 2017-09-30 ENCOUNTER — Encounter (HOSPITAL_COMMUNITY): Payer: Self-pay | Admitting: Emergency Medicine

## 2017-09-30 ENCOUNTER — Emergency Department (HOSPITAL_COMMUNITY)
Admission: EM | Admit: 2017-09-30 | Discharge: 2017-09-30 | Disposition: A | Discharge status: Home / Self Care | Payer: Worker's Compensation | Attending: Emergency Medicine | Admitting: Emergency Medicine

## 2017-09-30 DIAGNOSIS — E039 Hypothyroidism, unspecified: Secondary | ICD-10-CM | POA: Diagnosis not present

## 2017-09-30 DIAGNOSIS — X500XXD Overexertion from strenuous movement or load, subsequent encounter: Secondary | ICD-10-CM | POA: Insufficient documentation

## 2017-09-30 DIAGNOSIS — M545 Low back pain: Secondary | ICD-10-CM | POA: Diagnosis present

## 2017-09-30 DIAGNOSIS — S3992XD Unspecified injury of lower back, subsequent encounter: Secondary | ICD-10-CM | POA: Insufficient documentation

## 2017-09-30 DIAGNOSIS — R200 Anesthesia of skin: Secondary | ICD-10-CM | POA: Diagnosis not present

## 2017-09-30 DIAGNOSIS — G8929 Other chronic pain: Secondary | ICD-10-CM

## 2017-09-30 DIAGNOSIS — M5442 Lumbago with sciatica, left side: Secondary | ICD-10-CM | POA: Diagnosis not present

## 2017-09-30 LAB — I-STAT CHEM 8, ED
BUN: 13 mg/dL (ref 6–20)
CALCIUM ION: 1.21 mmol/L (ref 1.15–1.40)
CHLORIDE: 104 mmol/L (ref 101–111)
Creatinine, Ser: 1.1 mg/dL (ref 0.61–1.24)
Glucose, Bld: 74 mg/dL (ref 65–99)
HCT: 42 % (ref 39.0–52.0)
Hemoglobin: 14.3 g/dL (ref 13.0–17.0)
Potassium: 4.1 mmol/L (ref 3.5–5.1)
SODIUM: 141 mmol/L (ref 135–145)
TCO2: 25 mmol/L (ref 22–32)

## 2017-09-30 LAB — TSH: TSH: 17.526 u[IU]/mL — ABNORMAL HIGH (ref 0.350–4.500)

## 2017-09-30 MED ORDER — LIDOCAINE 5 % EX PTCH
1.0000 | MEDICATED_PATCH | Freq: Once | CUTANEOUS | Status: DC
Start: 2017-09-30 — End: 2017-09-30
  Administered 2017-09-30: 1 via TRANSDERMAL
  Filled 2017-09-30: qty 1

## 2017-09-30 MED ORDER — TRAMADOL HCL 50 MG PO TABS: 50.0000 mg | ORAL_TABLET | Freq: Four times a day (QID) | ORAL | 0 refills | Status: AC | PRN

## 2017-09-30 MED ORDER — LEVOTHYROXINE SODIUM 150 MCG PO TABS: 250.0000 ug | ORAL_TABLET | Freq: Every day | ORAL | 0 refills | Status: DC

## 2017-09-30 MED ORDER — LIDOCAINE 5 % EX PTCH: 1.0000 | MEDICATED_PATCH | CUTANEOUS | 0 refills | Status: DC

## 2017-09-30 MED ORDER — NAPROXEN 375 MG PO TABS: 375.0000 mg | ORAL_TABLET | Freq: Two times a day (BID) | ORAL | 0 refills | Status: AC

## 2017-09-30 MED ORDER — OXYCODONE-ACETAMINOPHEN 5-325 MG PO TABS
1.0000 | ORAL_TABLET | Freq: Once | ORAL | Status: AC
Start: 2017-09-30 — End: 2017-09-30
  Administered 2017-09-30: 1 via ORAL
  Filled 2017-09-30: qty 1

## 2017-09-30 MED ORDER — KETOROLAC TROMETHAMINE 30 MG/ML IJ SOLN
30.0000 mg | Freq: Once | INTRAMUSCULAR | Status: AC
  Administered 2017-09-30: 30 mg via INTRAMUSCULAR
  Filled 2017-09-30: qty 1

## 2017-09-30 MED ORDER — METHYLPREDNISOLONE 4 MG PO TBPK: ORAL_TABLET | ORAL | 0 refills | Status: DC

## 2017-09-30 NOTE — ED Notes (Addendum)
Patient transported to CT 

## 2017-09-30 NOTE — Discharge Planning (Signed)
[facility]  Advised that pt needs to visit office upon discharge today, fill out packet and he will be give appointment at that time.

## 2017-09-30 NOTE — Discharge Instructions (Signed)
Your thyroid levels are high.  Continue take your medication to make sure that you follow-up with a primary care doctor today to find an appointment the next week or 2.  Your thyroid levels need to be checked in your medication may need to be changed.  Terms of her low back pain.  You do have a herniated disc.  You would benefit from a follow-up with a neurosurgeon.  We will start you on a steroid pack.  Please take the Naproxen as prescribed for pain. Do not take any additional NSAIDs including Motrin, Aleve, Ibuprofen, Advil.  Also given a short course of pain medication this medication may cause drowsiness do not drive with it.  Also given lidocaine patches to apply to the area for pain control.  Perform low back stretches.  Return to the ED if you develop any worsening symptoms including elevated heart rate, fevers, vomiting or for any other reason.   SEEK IMMEDIATE MEDICAL ATTENTION IF: New numbness, tingling, weakness, or problem with the use of your arms or legs.  Severe back pain not relieved with medications.  Change in bowel or bladder control.  Urinary retention.  Numbness in your groin.  Increasing pain in any areas of the body (such as chest or abdominal pain).  Shortness of breath, dizziness or fainting.  Nausea (feeling sick to your stomach), vomiting, fever, or sweats.
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
## 2017-09-30 NOTE — ED Notes (Signed)
Pt given discharge instructions and pt was informed of f/u appointments. Medications discussed, pt had no further questions. Pt ambulated to lobby without complication.

## 2017-09-30 NOTE — ED Notes (Signed)
Pt requested oxycodone be crushed and mixed with water bc he "chokes on small pills". EDP cleared RN to crush and mix medication. Pt took meds without complication.

## 2017-09-30 NOTE — ED Triage Notes (Signed)
Per Pt: C/O of back pain since jan 2018. Pain has gotten worse in the last week after pt lifted heavy bag. Pain is mostly in the lower back and on the L side. Pt L thigh has been numb x 1 week. Pt A&OX4, ambulatory with steady gait.

## 2018-01-18 ENCOUNTER — Emergency Department (HOSPITAL_COMMUNITY)
Admission: EM | Admit: 2018-01-18 | Discharge: 2018-01-18 | Disposition: A | Discharge status: Home / Self Care | Payer: Worker's Compensation | Attending: Emergency Medicine | Admitting: Emergency Medicine

## 2018-01-18 ENCOUNTER — Other Ambulatory Visit: Payer: Self-pay

## 2018-01-18 ENCOUNTER — Encounter (HOSPITAL_COMMUNITY): Payer: Self-pay

## 2018-01-18 DIAGNOSIS — Z79899 Other long term (current) drug therapy: Secondary | ICD-10-CM | POA: Diagnosis not present

## 2018-01-18 DIAGNOSIS — E079 Disorder of thyroid, unspecified: Secondary | ICD-10-CM | POA: Diagnosis not present

## 2018-01-18 DIAGNOSIS — M5442 Lumbago with sciatica, left side: Secondary | ICD-10-CM | POA: Diagnosis not present

## 2018-01-18 DIAGNOSIS — M545 Low back pain: Secondary | ICD-10-CM | POA: Diagnosis present

## 2018-01-18 LAB — URINALYSIS, ROUTINE W REFLEX MICROSCOPIC
Bilirubin Urine: NEGATIVE
Glucose, UA: NEGATIVE mg/dL
Hgb urine dipstick: NEGATIVE
Ketones, ur: NEGATIVE mg/dL
Leukocytes, UA: NEGATIVE
Nitrite: NEGATIVE
Protein, ur: NEGATIVE mg/dL
Specific Gravity, Urine: 1.024 (ref 1.005–1.030)
pH: 5 (ref 5.0–8.0)

## 2018-01-18 MED ORDER — LIDOCAINE 5 % EX PTCH: 1.0000 | MEDICATED_PATCH | CUTANEOUS | 0 refills | Status: AC

## 2018-01-18 MED ORDER — CYCLOBENZAPRINE HCL 10 MG PO TABS: 10.0000 mg | ORAL_TABLET | Freq: Two times a day (BID) | ORAL | 0 refills | Status: AC | PRN

## 2018-01-18 MED ORDER — ACETAMINOPHEN 500 MG PO TABS: 500.0000 mg | ORAL_TABLET | Freq: Four times a day (QID) | ORAL | 0 refills | Status: AC | PRN

## 2018-01-18 MED ORDER — LEVOTHYROXINE SODIUM 150 MCG PO TABS: 250.0000 ug | ORAL_TABLET | Freq: Every day | ORAL | 0 refills | Status: AC

## 2018-01-18 MED ORDER — METHYLPREDNISOLONE 4 MG PO TBPK
ORAL_TABLET | ORAL | 0 refills | Status: AC
Start: 2018-01-18 — End: ?

## 2018-01-18 NOTE — ED Triage Notes (Signed)
Pt states lower back pain X3 weeks. This is a recurrent problem. Pt reports some numbness to his left leg. Ambulatory. Pt denies loss of bowel or bladder.

## 2018-01-18 NOTE — ED Provider Notes (Signed)
Patient placed in Quick Look pathway, seen and evaluated   Chief Complaint: low back pain  HPI:   Pt reports pain in back and down left leg  ROS: no loss of bowel or bladder control  Physical Exam:   Gen: No distress  Neuro: Awake and Alert  Skin: Warm    Focused Exam: Normal respiration, Normal heart rate   Initiation of care has begun. The patient has been counseled on the process, plan, and necessity for staying for the completion/evaluation, and the remainder of the medical screening examination   Osie CheeksSofia, Jilleen Essner K, PA-C 01/18/18 1422    Derwood KaplanNanavati, Ankit, MD 01/20/18 [phone]

## 2018-01-18 NOTE — ED Provider Notes (Signed)
[facility] EMERGENCY DEPARTMENT Provider Note   CSN: [id number] Arrival date & time: 01/18/18  1411     History   Chief Complaint Chief Complaint  Patient presents with  . Back Pain    HPI Raymond Nielsen is a 52 y.o. male with history of chronic back pain and thyroid disorder  presents today for evaluation of acute on chronic left-sided low back pain for 3 weeks.  He has been experiencing back pain since an accident which occurred in [place] over one year ago.  Pain is constant, throbbing, aching, radiates down the posterior aspect of the left lower extremity.  He also has chronic numbness of the left lateral thigh which has been present since his accident and is unchanged.  Pain worsens with ambulation, prolonged positioning, and bending.  Denies any recent trauma or falls.  He was seen and evaluated on 09/30/2017 for the same although today he endorses urinary frequency which is been present for the past 3 weeks as well.  He denies abdominal pain, nausea, vomiting, pain with bowel movements, hematuria, saddle anesthesia, IV drug use, fevers, or bowel or bladder incontinence.  He has been going to physical therapy which is temporarily helpful.  He is also requesting refill of his levothyroxine which was also refilled on 09/30/2017.  He tells me he has not been able to follow-up with a primary care physician or a neurosurgeon or orthopedic surgeon for his back pain because "they do not cover Worker's Comp. from [place] down here ".  He later told me that he does see a physician for his back in [place].   The history is provided by the patient.    History reviewed. No pertinent past medical history.  There are no active problems to display for this patient.   History reviewed. No pertinent surgical history.      Home Medications    Prior to Admission medications   Medication Sig Start Date End Date Taking? Authorizing Provider  acetaminophen (TYLENOL) 500 MG  tablet Take 1 tablet (500 mg total) by mouth every 6 (six) hours as needed. 01/18/18   Kamarius Buckbee A, PA-C  cyclobenzaprine (FLEXERIL) 10 MG tablet Take 1 tablet (10 mg total) by mouth 2 (two) times daily as needed for muscle spasms. 01/18/18   Luevenia Maxin, Tidus Upchurch A, PA-C  levothyroxine (SYNTHROID, LEVOTHROID) 150 MCG tablet Take 1.5 tablets (225 mcg total) by mouth daily before breakfast. 01/18/18 02/17/18  Michela Pitcher A, PA-C  lidocaine (LIDODERM) 5 % Place 1 patch onto the skin daily. Remove & Discard patch within 12 hours or as directed by MD 01/18/18   Michela Pitcher A, PA-C  methylPREDNISolone (MEDROL DOSEPAK) 4 MG TBPK tablet Take as directed 01/18/18   Michela Pitcher A, PA-C  naproxen (NAPROSYN) 375 MG tablet Take 1 tablet (375 mg total) by mouth 2 (two) times daily. 09/30/17   Rise Mu, PA-C  traMADol (ULTRAM) 50 MG tablet Take 1 tablet (50 mg total) by mouth every 6 (six) hours as needed. 09/30/17   Rise Mu, PA-C    Family History History reviewed. No pertinent family history.  Social History Social History   Tobacco Use  . Smoking status: Never Smoker  . Smokeless tobacco: Never Used  Substance Use Topics  . Alcohol use: No    Frequency: Never  . Drug use: No     Allergies   Iodine   Review of Systems Review of Systems  Constitutional: Negative for chills and fever.  Genitourinary: Positive for frequency. Negative for decreased urine volume, dysuria, hematuria, penile swelling, scrotal swelling, testicular pain and urgency.  Musculoskeletal: Positive for back pain.     Physical Exam Updated Vital Signs BP 111/85 (BP Location: Left Arm)   Pulse 61   Temp 98.4 F (36.9 C) (Oral)   Resp 16   SpO2 100%   Physical Exam  Constitutional: He is oriented to person, place, and time. He appears well-developed and well-nourished. No distress.  HENT:  Head: Normocephalic and atraumatic.  Eyes: Conjunctivae are normal. Right eye exhibits no discharge. Left eye exhibits  no discharge.  Neck: No JVD present. No tracheal deviation present.  Cardiovascular: Normal rate.  Pulmonary/Chest: Effort normal.  Abdominal: Soft. Bowel sounds are normal. He exhibits no distension. There is no tenderness. There is no guarding.  Musculoskeletal: He exhibits tenderness. He exhibits no edema.  No midline spine tenderness, diffuse left paralumbar muscle tenderness.  No deformity, crepitus, ecchymosis, or step-off noted.  Positive straight leg raise on the left.  5/5 strength of BLE major muscle groups.  Neurological: He is alert and oriented to person, place, and time.  Fluent speech with no evidence of dysarthria or aphasia, no facial droop, mildly altered sensation to the lateral aspect of the left thigh.  Patient states this is chronic and unchanged.  Ambulatory without difficulty, able to Heel Walk and Toe Walk without difficulty.  Skin: Skin is warm and dry. No erythema.  Psychiatric: He has a normal mood and affect. His behavior is normal.  Nursing note and vitals reviewed.    ED Treatments / Results  Labs (all labs ordered are listed, but only abnormal results are displayed) Labs Reviewed  URINALYSIS, ROUTINE W REFLEX MICROSCOPIC    EKG None  Radiology No results found.  Procedures Procedures (including critical care time)  Medications Ordered in ED Medications - No data to display   Initial Impression / Assessment and Plan / ED Course  I have reviewed the triage vital signs and the nursing notes.  Pertinent labs & imaging results that were available during my care of the patient were reviewed by me and considered in my medical decision making (see chart for details).     Patient presents with complaint of left-sided low back pain.  He has chronic low back pain from an injury in [place] over one year ago.  He is afebrile, vital signs are stable.  Also complaining of urinary frequency but UA is not concerning for UTI, prostatitis, or nephrolithiasis.   No red flag signs concerning for cauda equina.  He is ambulatory without difficulty despite pain.  He did have relief with Lidoderm patches, muscle relaxers, and steroid packs in the past, will prescribe again.  He does have a physician whom he is seeing in [place] for his back pain.  He also requested a refill of his levothyroxine which he ran out of 1.5 weeks ago.  I have referred him to [facility] health and wellness to establish primary care services and follow-up on his thyroid disorder.  No evidence of myxedema, or other life-threatening thyroid condition.  Discussed strict ED return precautions. Pt verbalized understanding of and agreement with plan and is safe for discharge home at this time.  No complaints prior to discharge.  Final Clinical Impressions(s) / ED Diagnoses   Final diagnoses:  Acute left-sided low back pain with left-sided sciatica    ED Discharge Orders        Ordered    levothyroxine (SYNTHROID,  LEVOTHROID) 150 MCG tablet  Daily before breakfast     01/18/18 1726    methylPREDNISolone (MEDROL DOSEPAK) 4 MG TBPK tablet     01/18/18 1726    lidocaine (LIDODERM) 5 %  Every 24 hours     01/18/18 1726    cyclobenzaprine (FLEXERIL) 10 MG tablet  2 times daily PRN     01/18/18 1728    acetaminophen (TYLENOL) 500 MG tablet  Every 6 hours PRN     01/18/18 1728       Jeanie SewerFawze, Teyanna Thielman A, PA-C 01/19/18 1517    Wynetta FinesMessick, Peter C, MD 01/19/18 2311

## 2018-01-18 NOTE — Discharge Instructions (Signed)
Apply lidocaine patches as prescribed.  Take the steroid pack as prescribed.  Do not take ibuprofen, Advil, Aleve, or Motrin while you are taking this medicine.  You may take 500 to 1000 mg of Tylenol every 6 hours as needed for pain in addition to the steroid pack.  When you are done with the steroid you may alternate 600 mg of ibuprofen and [phone] mg of Tylenol every 3 hours as needed for pain. Do not exceed 4000 mg of Tylenol daily.  Take steroid and ibuprofen with food to avoid upset stomach issues.  Apply ice or heat for comfort, do some gentle stretching to avoid muscle stiffness.  You may take Flexeril as needed for muscle spasm but do not drive, drink alcohol, or operate heavy machinery while taking these medications.  I have refilled your levothyroxine.  Follow-up with your neurosurgeon or spine surgeon for reevaluation of your back pain. Call [facility] and wellness tomorrow to set up a follow-up appointment for as soon as possible for reevaluation of your thyroid problem.  Tell them you were referred by the emergency department.  Return to the emergency department immediately for any concerning signs or symptoms develop such as loss of control of bowel or bladder, new weakness or numbness, fevers, or inability to walk.

## 2018-05-10 ENCOUNTER — Emergency Department (HOSPITAL_COMMUNITY): Payer: Self-pay

## 2018-05-10 ENCOUNTER — Emergency Department (HOSPITAL_COMMUNITY)
Admission: EM | Admit: 2018-05-10 | Discharge: 2018-05-10 | Disposition: A | Discharge status: Home / Self Care | Payer: Self-pay | Attending: Emergency Medicine | Admitting: Emergency Medicine

## 2018-05-10 ENCOUNTER — Encounter (HOSPITAL_COMMUNITY): Payer: Self-pay

## 2018-05-10 ENCOUNTER — Other Ambulatory Visit: Payer: Self-pay

## 2018-05-10 DIAGNOSIS — R0602 Shortness of breath: Secondary | ICD-10-CM | POA: Insufficient documentation

## 2018-05-10 DIAGNOSIS — Z79899 Other long term (current) drug therapy: Secondary | ICD-10-CM | POA: Insufficient documentation

## 2018-05-10 DIAGNOSIS — R079 Chest pain, unspecified: Secondary | ICD-10-CM | POA: Insufficient documentation

## 2018-05-10 HISTORY — DX: Disorder of thyroid, unspecified: E07.9

## 2018-05-10 LAB — CBC
HCT: 39.1 % (ref 39.0–52.0)
Hemoglobin: 13.2 g/dL (ref 13.0–17.0)
MCH: 30.7 pg (ref 26.0–34.0)
MCHC: 33.8 g/dL (ref 30.0–36.0)
MCV: 90.9 fL (ref 78.0–100.0)
PLATELETS: 227 10*3/uL (ref 150–400)
RBC: 4.3 MIL/uL (ref 4.22–5.81)
RDW: 13.4 % (ref 11.5–15.5)
WBC: 6.4 10*3/uL (ref 4.0–10.5)

## 2018-05-10 LAB — BASIC METABOLIC PANEL
Anion gap: 10 (ref 5–15)
BUN: 13 mg/dL (ref 6–20)
CALCIUM: 9.4 mg/dL (ref 8.9–10.3)
CHLORIDE: 105 mmol/L (ref 98–111)
CO2: 20 mmol/L — ABNORMAL LOW (ref 22–32)
CREATININE: 1.17 mg/dL (ref 0.61–1.24)
GFR calc non Af Amer: >60 mL/min (ref >60)
Glucose, Bld: 84 mg/dL (ref 70–99)
Potassium: 3.7 mmol/L (ref 3.5–5.1)
SODIUM: 135 mmol/L (ref 135–145)

## 2018-05-10 LAB — I-STAT TROPONIN, ED: TROPONIN I, POC: 0 ng/mL (ref 0.00–0.08)

## 2018-05-10 NOTE — ED Triage Notes (Signed)
Pt reports chest pain x 3 days.  Reports wife left him recently and thinks cp may be from stress.  EMS gave nitro x 1 and 324mg  asa and pain decreased.  Pt currently rates pain at 3.  Reports nausea, no vomiting or sob.

## 2018-05-10 NOTE — Discharge Instructions (Signed)
Your testing here has been unremarkable, there is no signs of heart attack or any other heart or lung problems.  This may be related to underlying anxiety or stress.  Please reach out to your family doctor for close follow-up, you may also follow-up with a cardiologist which I would recommend that you do within the next week.  Return to the emergency department for severe or worsening symptoms  [place] Primary [facility] Doctor List    Kari Baars MD. Specialty: Pulmonary Disease Contact information: [address]  [address]  [place] [address]  [phone]   Syliva Overman, MD. Specialty: [facility] Medicine Contact information: [address]  [place] [address]  [phone]   Lilyan Punt, MD. Specialty: [facility] Medicine Contact information: [address]  [place] [address]  [phone]   Avon Gully, MD Specialty: Internal Medicine Contact information: [address] [place] [address]  [phone]   Catalina Pizza, MD. Specialty: Internal Medicine Contact information: [address]  [place] [address]  [phone]    [facility] (Dr. Selena Batten) Specialty: Family Medicine Contact information: [address]  [place] [address]  [phone]   John Giovanni, MD. Specialty: [facility] Medicine Contact information: [address]  [address]  [place] [address]  [phone]   Carylon Perches, MD. Specialty: Internal Medicine Contact information: [address]  [address]  [place] [address]  [phone]    [facility]  [address] [place], [place] [address] [phone]  Services [facility] offers a variety of basic health services.  Services include but are not limited to: Blood pressure checks  Heart rate checks  Blood sugar checks  Urine analysis  Rapid strep tests  Pregnancy tests.  Health education and  referrals  People needing more complex services will be directed to a physician online. Using these virtual visits, doctors can evaluate and prescribe medicine and treatments. There will be no medication on-site, though [facility] will help patients fill their prescriptions at little to no cost.   For More information please go to: [email or web address]

## 2018-05-10 NOTE — ED Provider Notes (Signed)
[facility] EMERGENCY DEPARTMENT Provider Note   CSN: [id number] Arrival date & time: 05/10/18  1337     History   Chief Complaint Chief Complaint  Patient presents with  . Chest Pain    HPI Raymond Nielsen is a 52 y.o. male.  HPI Patient is a 52 year old male, he presents to the hospital with a complaint of chest discomfort.  The patient denies any chronic medical conditions other than hypothyroidism for which she has recently been treated, he has been on medications for several days after being out of his medicines for 3 weeks.  There is no history of hypertension diabetes hypercholesterolemia, tobacco or alcohol use.  He reports that over the last several days he has had a couple of episodes of chest discomfort.  He reports that this is not exertional or positional, he does have some lightheadedness with it, no shortness of breath fevers chills nausea vomiting or diarrhea or swelling of the lower extremities.  He reports that he is currently disabled, he is a former [employer], he because of lower back pain and a herniated disc has been unable to work for over a year.  He has not been evaluated for this chest pain yet, he states it came on while driving the other night, he decided not to come to the hospital at that time, he came back today, the paramedics gave him aspirin and nitroglycerin, at this time his symptoms are mild but persistent. Past Medical History:  Diagnosis Date  . Thyroid disease     There are no active problems to display for this patient.   History reviewed. No pertinent surgical history.      Home Medications    Prior to Admission medications   Medication Sig Start Date End Date Taking? Authorizing Provider  acetaminophen (TYLENOL) 500 MG tablet Take 1 tablet (500 mg total) by mouth every 6 (six) hours as needed. 01/18/18   Fawze, Mina A, PA-C  cyclobenzaprine (FLEXERIL) 10 MG tablet Take 1 tablet (10 mg total) by mouth 2 (two) times daily as needed for  muscle spasms. 01/18/18   Luevenia Maxin, Mina A, PA-C  levothyroxine (SYNTHROID, LEVOTHROID) 150 MCG tablet Take 1.5 tablets (225 mcg total) by mouth daily before breakfast. 01/18/18 02/17/18  Michela Pitcher A, PA-C  lidocaine (LIDODERM) 5 % Place 1 patch onto the skin daily. Remove & Discard patch within 12 hours or as directed by MD 01/18/18   Michela Pitcher A, PA-C  methylPREDNISolone (MEDROL DOSEPAK) 4 MG TBPK tablet Take as directed 01/18/18   Michela Pitcher A, PA-C  naproxen (NAPROSYN) 375 MG tablet Take 1 tablet (375 mg total) by mouth 2 (two) times daily. 09/30/17   Rise Mu, PA-C  traMADol (ULTRAM) 50 MG tablet Take 1 tablet (50 mg total) by mouth every 6 (six) hours as needed. 09/30/17   Rise Mu, PA-C    Family History No family history on file.  Social History Social History   Tobacco Use  . Smoking status: Never Smoker  . Smokeless tobacco: Never Used  Substance Use Topics  . Alcohol use: No    Frequency: Never  . Drug use: No     Allergies   Iodine   Review of Systems Review of Systems  All other systems reviewed and are negative.    Physical Exam Updated Vital Signs BP 112/76   Pulse 63   Temp 98.1 F (36.7 C) (Oral)   Resp 18   Ht 1.829 m (6')   Wt  99.8 kg   SpO2 100%   BMI 29.84 kg/m   Physical Exam  Constitutional: He appears well-developed and well-nourished. No distress.  HENT:  Head: Normocephalic and atraumatic.  Mouth/Throat: Oropharynx is clear and moist. No oropharyngeal exudate.  Eyes: Pupils are equal, round, and reactive to light. Conjunctivae and EOM are normal. Right eye exhibits no discharge. Left eye exhibits no discharge. No scleral icterus.  Neck: Normal range of motion. Neck supple. No JVD present. No thyromegaly present.  Cardiovascular: Normal rate, regular rhythm, normal heart sounds and intact distal pulses. Exam reveals no gallop and no friction rub.  No murmur heard. Pulmonary/Chest: Effort normal and breath sounds normal.  No respiratory distress. He has no wheezes. He has no rales.  Abdominal: Soft. Bowel sounds are normal. He exhibits no distension and no mass. There is no tenderness.  Musculoskeletal: Normal range of motion. He exhibits no edema or tenderness.  Lymphadenopathy:    He has no cervical adenopathy.  Neurological: He is alert. Coordination normal.  Skin: Skin is warm and dry. No rash noted. No erythema.  Psychiatric: He has a normal mood and affect. His behavior is normal.  Tearful, mildly anxious  Nursing note and vitals reviewed.    ED Treatments / Results  Labs (all labs ordered are listed, but only abnormal results are displayed) Labs Reviewed  BASIC METABOLIC PANEL - Abnormal; Notable for the following components:      Result Value   CO2 20 (*)    All other components within normal limits  CBC  I-STAT TROPONIN, ED    EKG EKG Interpretation  Date/Time:  Monday May 10 2018 13:43:05 EDT Ventricular Rate:  61 PR Interval:    QRS Duration: 98 QT Interval:  411 QTC Calculation: 414 R Axis:   56 Text Interpretation:  Sinus rhythm No old tracing to compare Confirmed by Eber Hong ([id number]) on 05/10/2018 1:56:12 PM   Radiology Dg Chest 2 View  Result Date: 05/10/2018 CLINICAL DATA:  Chest pain shortness of breath for the past 4 days. Cough. History of asthma EXAM: CHEST - 2 VIEW COMPARISON:  None. FINDINGS: Normal sized heart. Clear lungs. Mild peribronchial thickening. Unremarkable bones. IMPRESSION: Mild bronchitic changes. Electronically Signed   By: Beckie Salts M.D.   On: 05/10/2018 14:24    Procedures Procedures (including critical care time)  Medications Ordered in ED Medications - No data to display   Initial Impression / Assessment and Plan / ED Course  I have reviewed the triage vital signs and the nursing notes.  Pertinent labs & imaging results that were available during my care of the patient were reviewed by me and considered in my medical decision  making (see chart for details).  Clinical Course as of May 10 1525  Mon May 10, 2018  1523 3 shows bronchitic changes, no signs of pneumothorax or pneumonia or infiltrate, troponin is negative, blood counts are normal, metabolic panel is unremarkable.  The patient's vital signs remain normal, again his EKG was unremarkable, the patient is very low risk for cardiac disease and at this time appears stable for discharge.  He will be given the cardiology phone number for close follow-up.   [BM]    Clinical Course User Index [BM] Eber Hong, MD    The patient's exam is unremarkable, there is no reproducible tenderness over the chest wall, his heart and lung exams are normal, EKG is normal, symptoms are not exertional, he has had multiple losses in his life over the  last 6 months including his father dying 6 months ago and now the mother of his children took his children and went to [place] within the last couple of weeks.  He is currently living in an apartment by himself unemployed, he appears anxious on exam and I suspect that this is probably the root cause of his symptoms.  That being said we will obtain a troponin and a chest x-ray, he is Already been given aspirin.  The patient has a negative work-up, stable for discharge at this time    Final Clinical Impressions(s) / ED Diagnoses   Final diagnoses:  Chest pain, unspecified type    ED Discharge Orders    None       Eber Hong, MD 05/10/18 1526

## 2019-11-05 IMAGING — CT CT L SPINE W/O CM
3 series · 9 of 33 positions shown, 11 images · non-contrast
Comparison: None.

CLINICAL DATA: Worsening low back pain. Left thigh numbness for the
past week.

EXAM:
CT LUMBAR SPINE WITHOUT CONTRAST
TECHNIQUE: Multidetector CT imaging of the lumbar spine was performed without
intravenous contrast administration. Multiplanar CT image
reconstructions were also generated.

[Series 4: l-spine 2.0 st · axial · 0.35mm/px · z∈[+991,+991]mm · 1 of 157 slices shown, 2 images]
[im 85/157  soft-tissue]
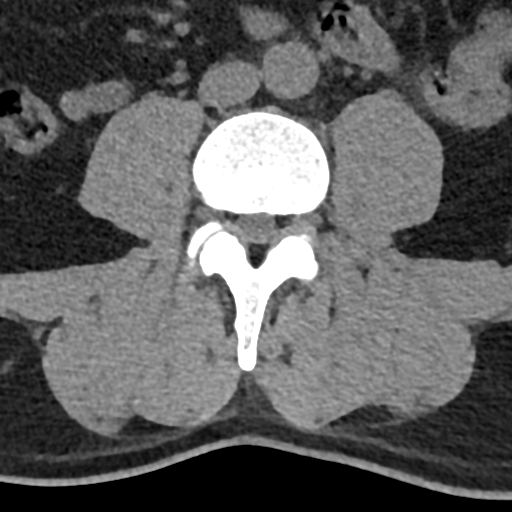
[im 85/157  bone]
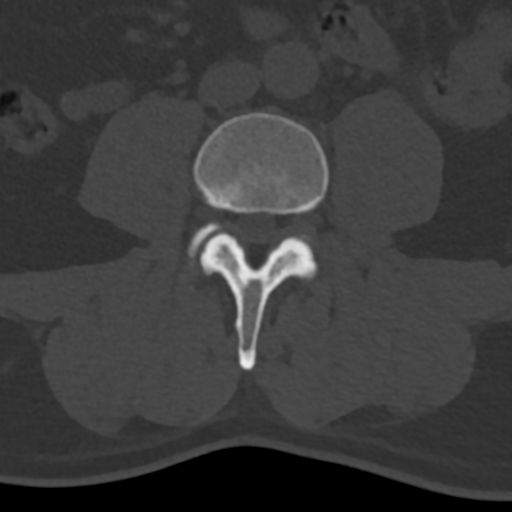

[Series 8: l-spine 2.0 cor bone · coronal · 0.31mm/px · 3 of 76 slices shown]
[im 16/76  bone]
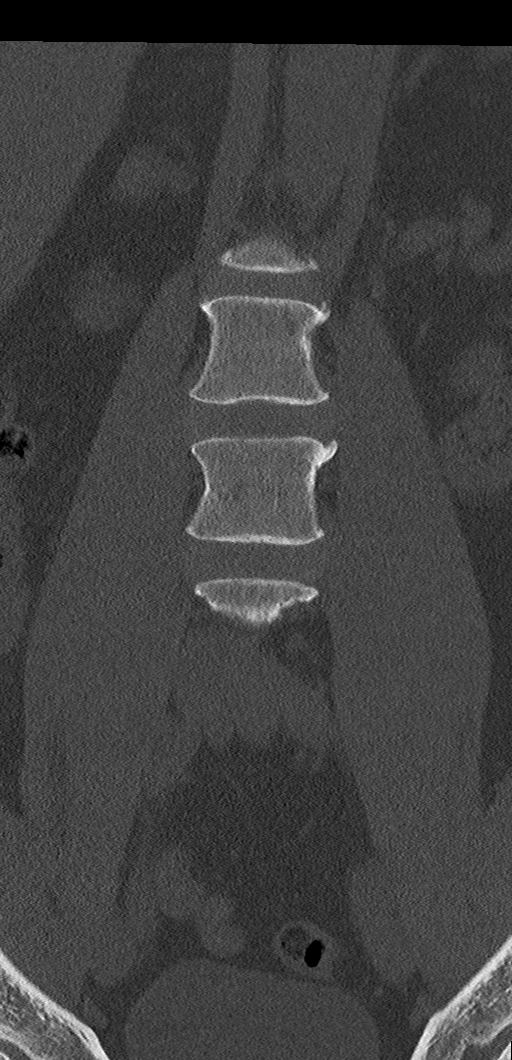
[im 31/76  bone]
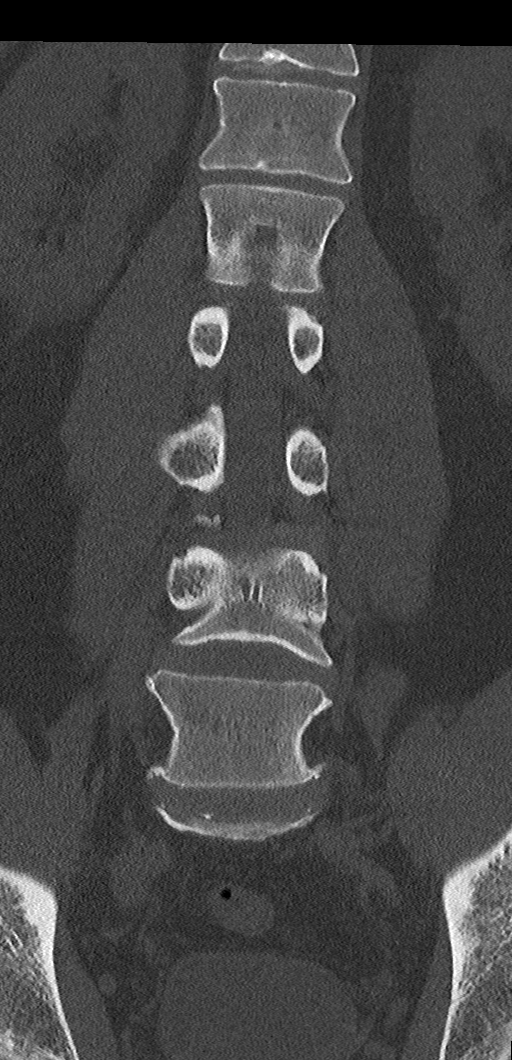
[im 46/76  bone]
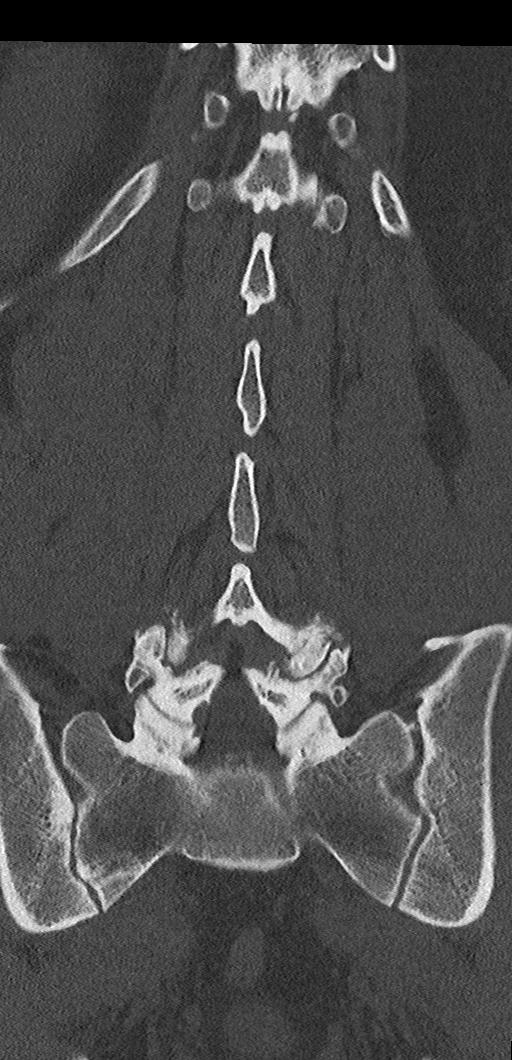

[Series 9: l-spine 2.0 sag bone · sagittal · 0.33mm/px · 5 of 77 slices shown, 6 images]
[im 26/77  bone]
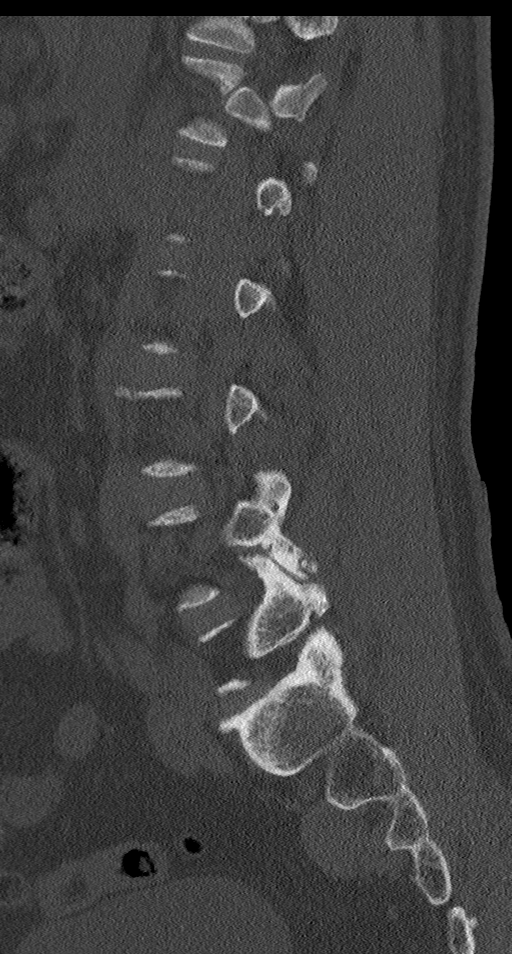
[im 32/77  bone]
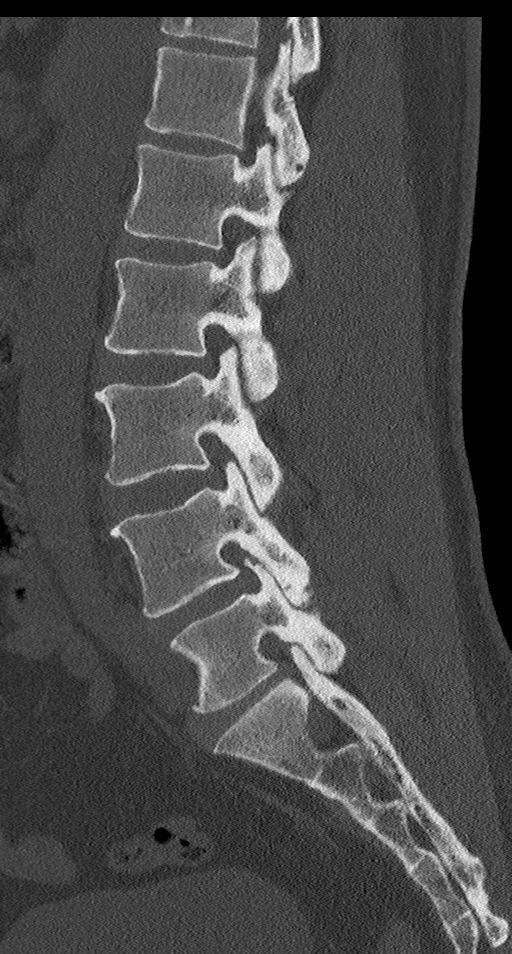
[im 39/77  soft-tissue]
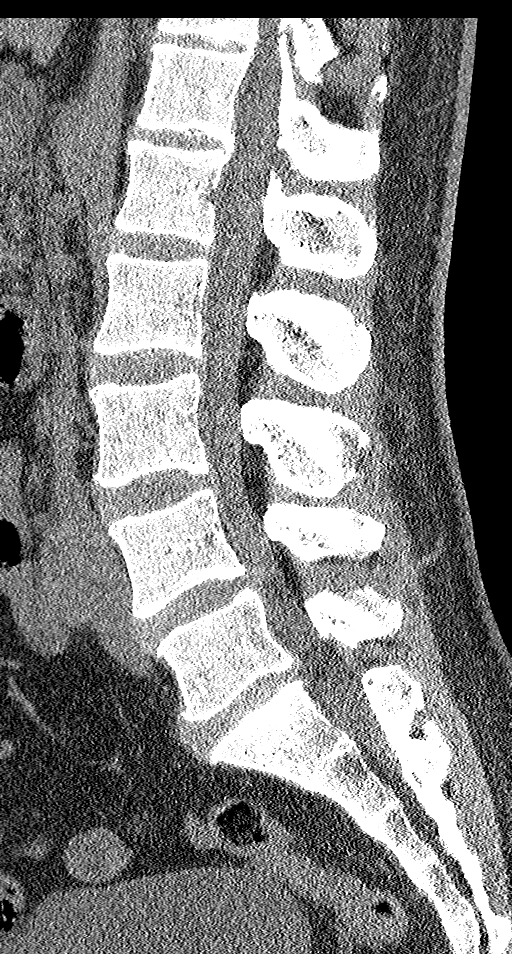
[im 39/77  bone]
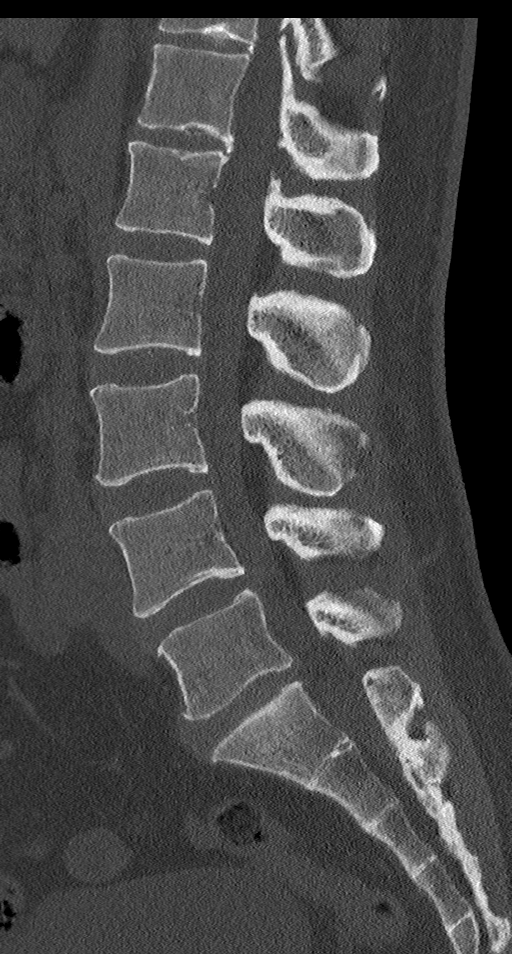
[im 45/77  bone]
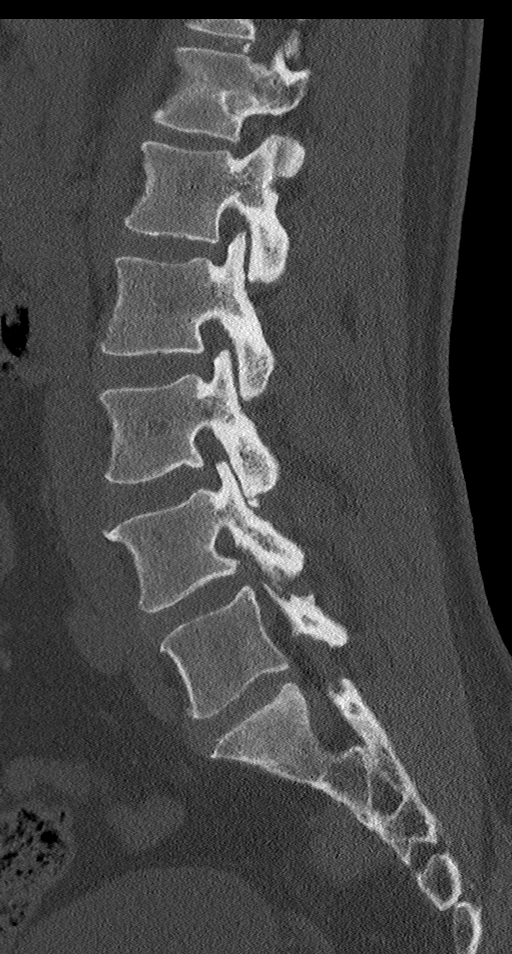
[im 51/77  bone]
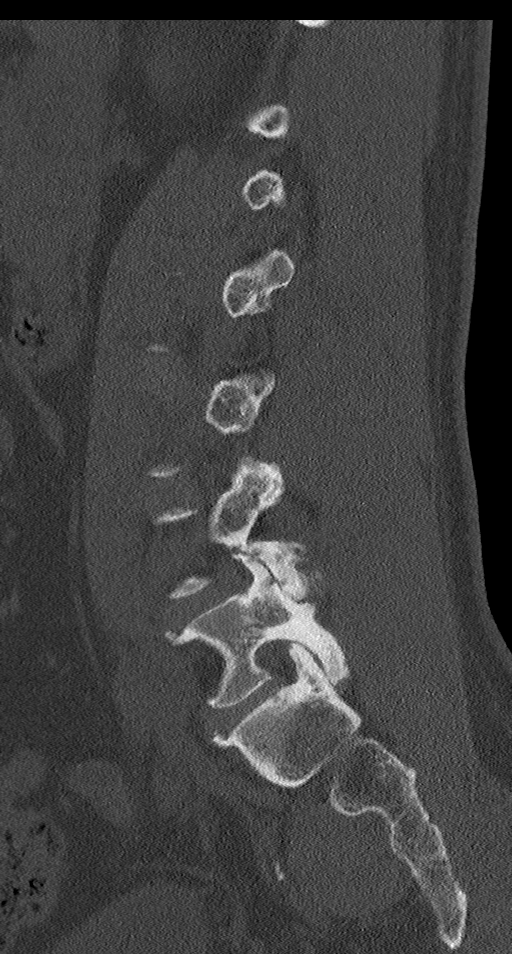

[9 of 33 positions shown; findings below may reference images not displayed]

FINDINGS: Segmentation: 5 lumbar type vertebrae.

Alignment: Normal.

Vertebrae: Preserved vertebral body heights. No fracture or
suspicious osseous lesion. Slight disc space narrowing at L4-5 and
L5-S1. Asymmetric left SI joint spurring anteriorly.

Paraspinal and other soft tissues: Mild calcified atherosclerosis of
the iliac arteries.

Disc levels:

T11-12: Tiny calcified central disc protrusion and mild facet
spurring without stenosis.

T12-L1: Small right paracentral osteophyte and mild facet spurring
without stenosis.

L1-2: Negative.

L2-3: Minimal disc bulging and minimal left facet spurring without
stenosis.

L3-4: Mild disc bulging, prominent dorsal epidural fat, and
mild-to-moderate facet hypertrophy result in mild spinal stenosis
and mild bilateral neural foraminal stenosis.

L4-5: Broad right subarticular to a right extraforaminal disc
protrusion, disc bulging, severe facet arthrosis, and prominent
dorsal epidural fat result in mild spinal stenosis, right greater
than left lateral recess stenosis, and moderate to severe right and
moderate left neural foraminal stenosis.

L5-S1: Mild disc bulging results in mild left neural foraminal
stenosis without spinal stenosis.
IMPRESSION: 1. Moderate to severe right greater than left neural foraminal
stenosis and bilateral lateral recess stenosis at L4-5 due to disc
degeneration including a rightward protrusion and severe facet
arthrosis.
2. Mild spinal stenosis at L3-4.
3. Mild neural foraminal stenosis bilaterally at L3-4 and on the
left at L5-S1.

## 2020-06-14 IMAGING — DX DG CHEST 2V
2 series · 2 of 2 positions shown · non-contrast
Comparison: None.

CLINICAL DATA: Chest pain shortness of breath for the past 4 days.
Cough. History of asthma

EXAM:
CHEST - 2 VIEW

[chest pa]
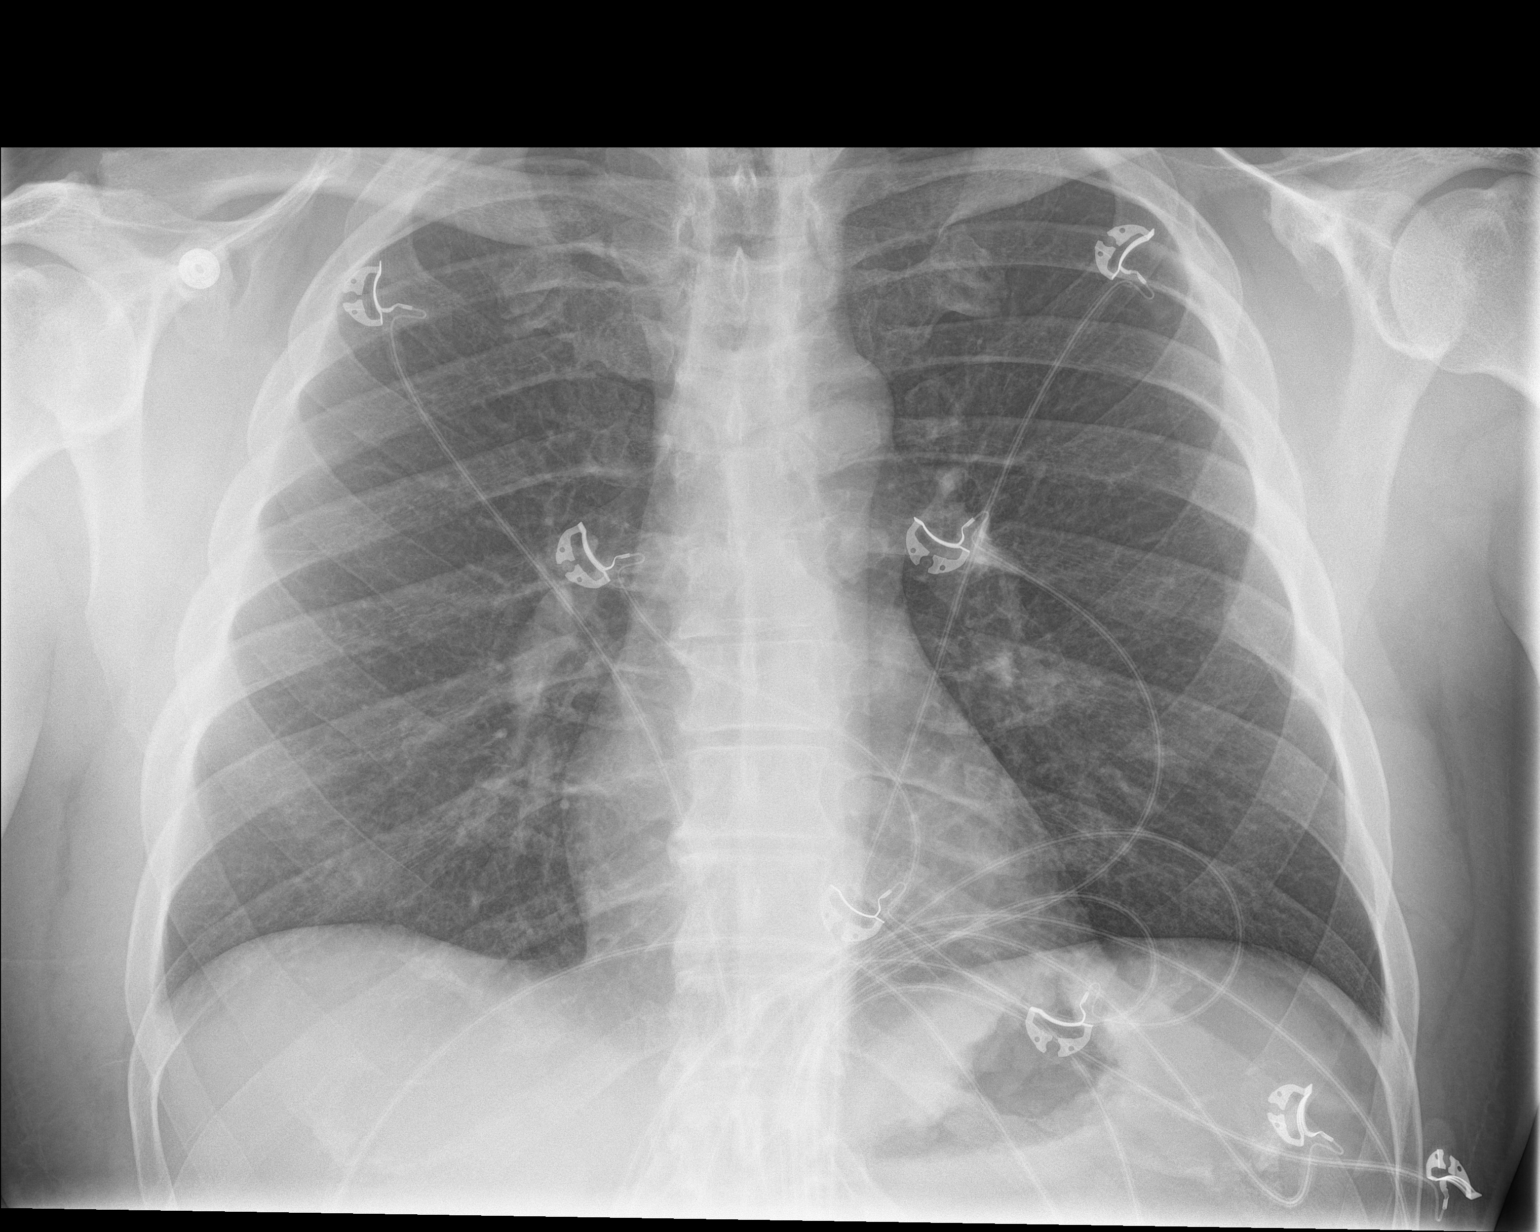

[chest lat]
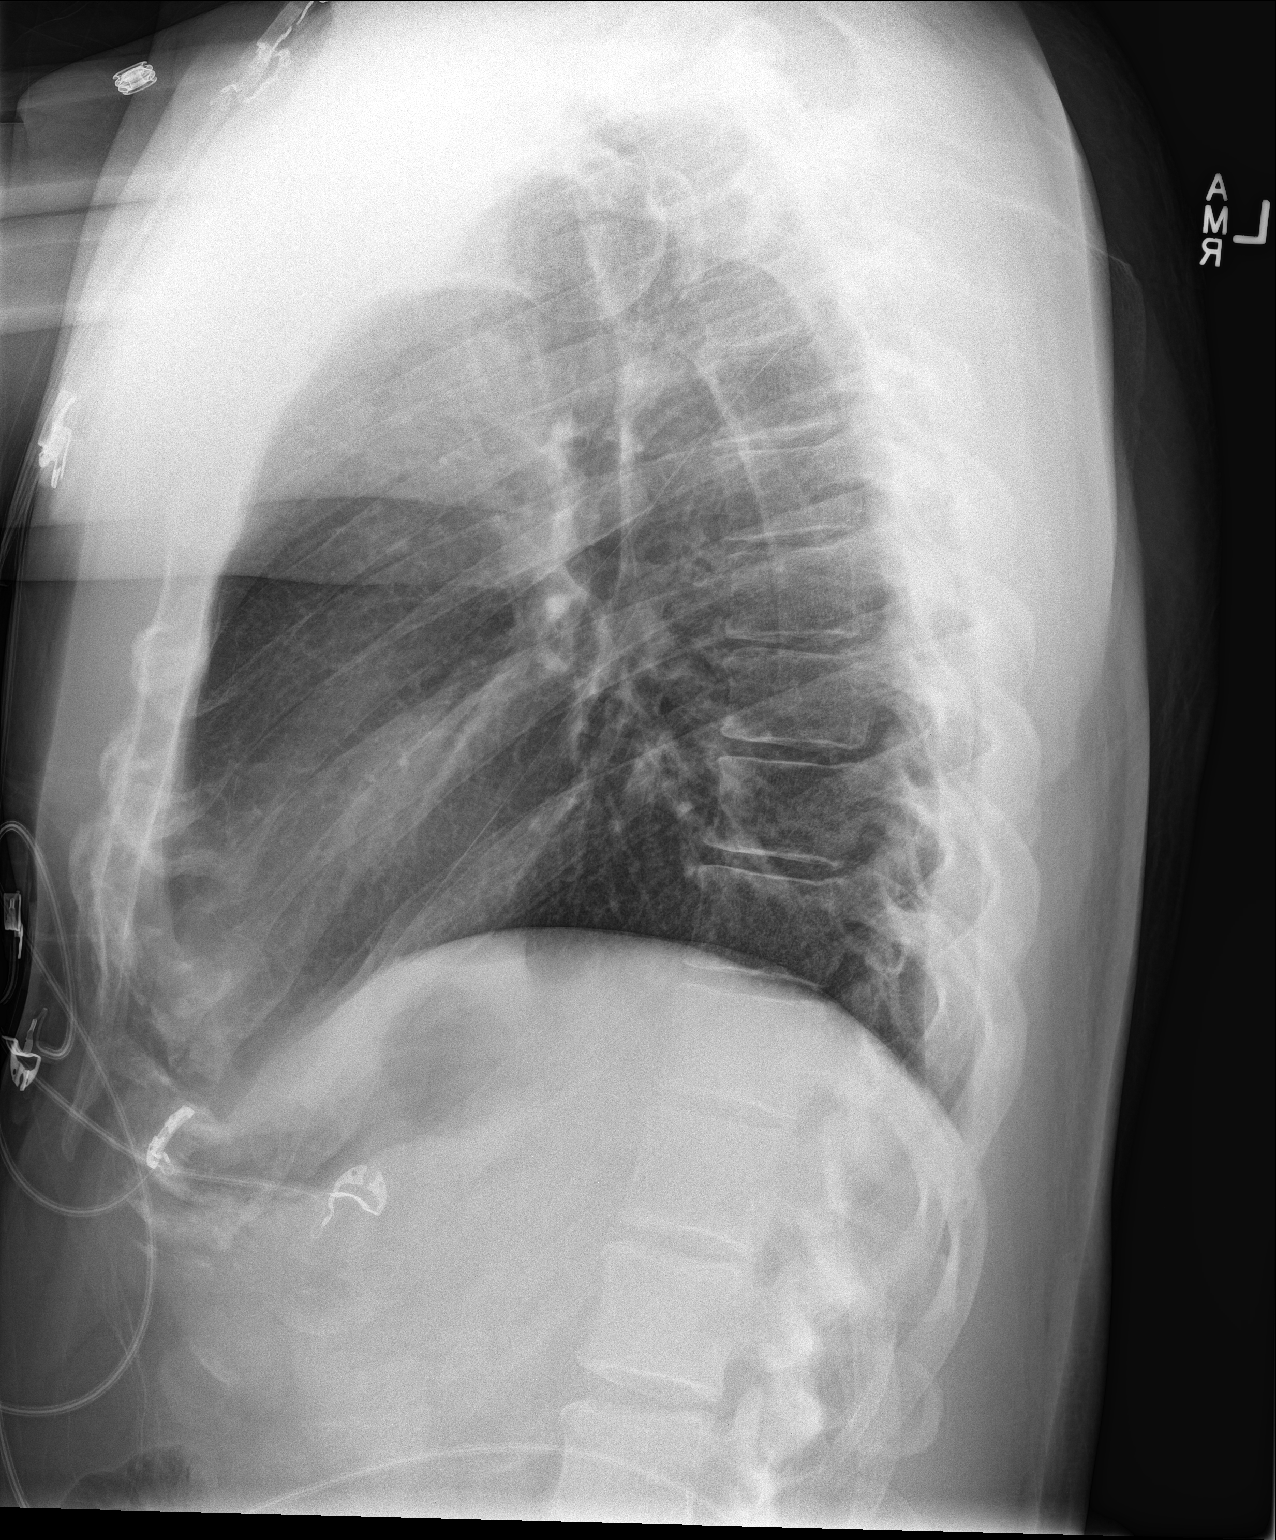

[2 of 2 positions shown; findings below may reference images not displayed]

FINDINGS: Normal sized heart. Clear lungs. Mild peribronchial thickening.
Unremarkable bones.
IMPRESSION: Mild bronchitic changes.

## 2022-02-13 DIAGNOSIS — M545 Low back pain, unspecified: Secondary | ICD-10-CM | POA: Diagnosis not present

## 2022-02-13 DIAGNOSIS — M25512 Pain in left shoulder: Secondary | ICD-10-CM | POA: Diagnosis not present

## 2022-02-13 DIAGNOSIS — Z043 Encounter for examination and observation following other accident: Secondary | ICD-10-CM | POA: Diagnosis not present
